# Patient Record
Sex: Female | Born: 1980 | Race: White | Hispanic: No | Marital: Single | Smoking: Current every day smoker
Health system: Southern US, Community
[De-identification: ages and names within clinical notes are randomized; demographics above are authoritative.]

## PROBLEM LIST (undated history)

## (undated) DIAGNOSIS — F419 Anxiety disorder, unspecified: Secondary | ICD-10-CM

## (undated) DIAGNOSIS — G4700 Insomnia, unspecified: Secondary | ICD-10-CM

## (undated) DIAGNOSIS — F1910 Other psychoactive substance abuse, uncomplicated: Secondary | ICD-10-CM

## (undated) DIAGNOSIS — F329 Major depressive disorder, single episode, unspecified: Secondary | ICD-10-CM

## (undated) DIAGNOSIS — K8020 Calculus of gallbladder without cholecystitis without obstruction: Secondary | ICD-10-CM

## (undated) HISTORY — DX: Other psychoactive substance abuse, uncomplicated: F19.10

---

## 2010-02-27 ENCOUNTER — Ambulatory Visit: Payer: Self-pay | Admitting: Internal Medicine

## 2010-02-27 ENCOUNTER — Inpatient Hospital Stay (HOSPITAL_COMMUNITY)
Admission: EM | Admit: 2010-02-27 | Discharge: 2010-03-03 | Disposition: A | Discharge status: Other Acute Inpatient Hospital | Payer: Self-pay | Source: Home / Self Care | Admitting: Emergency Medicine

## 2010-03-03 ENCOUNTER — Ambulatory Visit: Payer: Self-pay | Admitting: Psychiatry

## 2010-03-03 ENCOUNTER — Inpatient Hospital Stay (HOSPITAL_COMMUNITY): Admission: AD | Admit: 2010-03-03 | Discharge: 2010-03-07 | Payer: Self-pay | Admitting: Psychiatry

## 2010-04-20 ENCOUNTER — Ambulatory Visit: Payer: Self-pay | Admitting: Psychiatry

## 2010-08-19 LAB — DIFFERENTIAL
Eosinophils Absolute: 0.3 10*3/uL (ref 0.0–0.7)
Eosinophils Absolute: 0.3 10*3/uL (ref 0.0–0.7)
Eosinophils Relative: 1 % (ref 0–5)
Lymphocytes Relative: 21 % (ref 12–46)
Lymphocytes Relative: 32 % (ref 12–46)
Lymphs Abs: 2.7 10*3/uL (ref 0.7–4.0)
Lymphs Abs: 4.8 10*3/uL — ABNORMAL HIGH (ref 0.7–4.0)
Monocytes Absolute: 0.4 10*3/uL (ref 0.1–1.0)
Monocytes Relative: 2 % — ABNORMAL LOW (ref 3–12)
Monocytes Relative: 7 % (ref 3–12)
Neutro Abs: 4.9 10*3/uL (ref 1.7–7.7)
Neutrophils Relative %: 58 % (ref 43–77)

## 2010-08-19 LAB — CK TOTAL AND CKMB (NOT AT ARMC): Relative Index: 4.4 — ABNORMAL HIGH (ref 0.0–2.5)

## 2010-08-19 LAB — GLUCOSE, CAPILLARY
Glucose-Capillary: 101 mg/dL — ABNORMAL HIGH (ref 70–99)
Glucose-Capillary: 105 mg/dL — ABNORMAL HIGH (ref 70–99)
Glucose-Capillary: 106 mg/dL — ABNORMAL HIGH (ref 70–99)
Glucose-Capillary: 109 mg/dL — ABNORMAL HIGH (ref 70–99)
Glucose-Capillary: 111 mg/dL — ABNORMAL HIGH (ref 70–99)
Glucose-Capillary: 27 mg/dL — CL (ref 70–99)
Glucose-Capillary: 31 mg/dL — CL (ref 70–99)
Glucose-Capillary: 79 mg/dL (ref 70–99)
Glucose-Capillary: 86 mg/dL (ref 70–99)
Glucose-Capillary: 90 mg/dL (ref 70–99)
Glucose-Capillary: 91 mg/dL (ref 70–99)

## 2010-08-19 LAB — URINALYSIS, ROUTINE W REFLEX MICROSCOPIC
Ketones, ur: NEGATIVE mg/dL
Leukocytes, UA: NEGATIVE
Nitrite: NEGATIVE
Protein, ur: 100 mg/dL — AB
pH: 5.5 (ref 5.0–8.0)

## 2010-08-19 LAB — CBC
HCT: 34.8 % — ABNORMAL LOW (ref 36.0–46.0)
HCT: 35.2 % — ABNORMAL LOW (ref 36.0–46.0)
HCT: 39.1 % (ref 36.0–46.0)
Hemoglobin: 12 g/dL (ref 12.0–15.0)
Hemoglobin: 13.5 g/dL (ref 12.0–15.0)
Hemoglobin: 15.2 g/dL — ABNORMAL HIGH (ref 12.0–15.0)
MCH: 29.9 pg (ref 26.0–34.0)
MCH: 30.2 pg (ref 26.0–34.0)
MCH: 30.2 pg (ref 26.0–34.0)
MCH: 31.4 pg (ref 26.0–34.0)
MCHC: 33.3 g/dL (ref 30.0–36.0)
MCHC: 33.6 g/dL (ref 30.0–36.0)
MCHC: 34.1 g/dL (ref 30.0–36.0)
MCHC: 34.5 g/dL (ref 30.0–36.0)
MCV: 86.5 fL (ref 78.0–100.0)
MCV: 88.7 fL (ref 78.0–100.0)
Platelets: 264 K/uL (ref 150–400)
Platelets: 320 K/uL (ref 150–400)
Platelets: 364 10*3/uL (ref 150–400)
RBC: 3.97 MIL/uL (ref 3.87–5.11)
RBC: 4.52 MIL/uL (ref 3.87–5.11)
RBC: 4.84 MIL/uL (ref 3.87–5.11)
RDW: 12.4 % (ref 11.5–15.5)
RDW: 12.7 % (ref 11.5–15.5)
RDW: 12.9 % (ref 11.5–15.5)
WBC: 14.6 K/uL — ABNORMAL HIGH (ref 4.0–10.5)
WBC: 8.6 K/uL (ref 4.0–10.5)

## 2010-08-19 LAB — CARDIAC PANEL(CRET KIN+CKTOT+MB+TROPI)
CK, MB: 10.7 ng/mL (ref 0.3–4.0)
CK, MB: 6.4 ng/mL (ref 0.3–4.0)
CK, MB: 7.4 ng/mL (ref 0.3–4.0)
Relative Index: 2.5 (ref 0.0–2.5)
Relative Index: 2.9 — ABNORMAL HIGH (ref 0.0–2.5)
Relative Index: 4.1 — ABNORMAL HIGH (ref 0.0–2.5)
Relative Index: 4.4 — ABNORMAL HIGH (ref 0.0–2.5)
Total CK: 169 U/L (ref 7–177)
Total CK: 260 U/L — ABNORMAL HIGH (ref 7–177)
Total CK: 363 U/L — ABNORMAL HIGH (ref 7–177)
Troponin I: 0.52 ng/mL (ref 0.00–0.06)

## 2010-08-19 LAB — BASIC METABOLIC PANEL WITH GFR
BUN: <1 mg/dL — ABNORMAL LOW (ref 6–23)
BUN: <1 mg/dL — ABNORMAL LOW (ref 6–23)
CO2: 24 meq/L (ref 19–32)
CO2: 24 meq/L (ref 19–32)
Calcium: 8.2 mg/dL — ABNORMAL LOW (ref 8.4–10.5)
Calcium: 8.5 mg/dL (ref 8.4–10.5)
Chloride: 106 meq/L (ref 96–112)
Chloride: 110 meq/L (ref 96–112)
Creatinine, Ser: 0.57 mg/dL (ref 0.4–1.2)
Creatinine, Ser: 0.6 mg/dL (ref 0.4–1.2)
GFR calc non Af Amer: >60 mL/min
GFR calc non Af Amer: >60 mL/min
Glucose, Bld: 100 mg/dL — ABNORMAL HIGH (ref 70–99)
Glucose, Bld: 87 mg/dL (ref 70–99)
Potassium: 3.1 meq/L — ABNORMAL LOW (ref 3.5–5.1)
Potassium: 3.2 meq/L — ABNORMAL LOW (ref 3.5–5.1)
Sodium: 138 meq/L (ref 135–145)
Sodium: 140 meq/L (ref 135–145)

## 2010-08-19 LAB — BLOOD GAS, ARTERIAL
Acid-base deficit: 5.2 mmol/L — ABNORMAL HIGH (ref 0.0–2.0)
Bicarbonate: 18 meq/L — ABNORMAL LOW (ref 20.0–24.0)
Drawn by: 312761
FIO2: 0.5 %
MECHVT: 500 mL
O2 Saturation: 99.8 %
PEEP: 5 cmH2O
Patient temperature: 98.6
RATE: 22 {breaths}/min
TCO2: 18.8 mmol/L (ref 0–100)
pCO2 arterial: 25.9 mmHg — ABNORMAL LOW (ref 35.0–45.0)
pH, Arterial: 7.456 — ABNORMAL HIGH (ref 7.350–7.400)
pO2, Arterial: 197 mmHg — ABNORMAL HIGH (ref 80.0–100.0)

## 2010-08-19 LAB — COMPREHENSIVE METABOLIC PANEL WITH GFR
ALT: 13 U/L (ref 0–35)
ALT: 23 U/L (ref 0–35)
AST: 43 U/L — ABNORMAL HIGH (ref 0–37)
AST: 60 U/L — ABNORMAL HIGH (ref 0–37)
Albumin: 3.4 g/dL — ABNORMAL LOW (ref 3.5–5.2)
Albumin: 3.5 g/dL (ref 3.5–5.2)
Alkaline Phosphatase: 57 U/L (ref 39–117)
Alkaline Phosphatase: 57 U/L (ref 39–117)
BUN: 5 mg/dL — ABNORMAL LOW (ref 6–23)
BUN: 8 mg/dL (ref 6–23)
CO2: 21 meq/L (ref 19–32)
CO2: 24 meq/L (ref 19–32)
Calcium: 8.1 mg/dL — ABNORMAL LOW (ref 8.4–10.5)
Calcium: 9.3 mg/dL (ref 8.4–10.5)
Chloride: 102 meq/L (ref 96–112)
Chloride: 114 meq/L — ABNORMAL HIGH (ref 96–112)
Creatinine, Ser: 0.7 mg/dL (ref 0.4–1.2)
Creatinine, Ser: 0.75 mg/dL (ref 0.4–1.2)
GFR calc non Af Amer: >60 mL/min
GFR calc non Af Amer: >60 mL/min
Glucose, Bld: 111 mg/dL — ABNORMAL HIGH (ref 70–99)
Glucose, Bld: 80 mg/dL (ref 70–99)
Potassium: 3.1 meq/L — ABNORMAL LOW (ref 3.5–5.1)
Potassium: 5 meq/L (ref 3.5–5.1)
Sodium: 133 meq/L — ABNORMAL LOW (ref 135–145)
Sodium: 139 meq/L (ref 135–145)
Total Bilirubin: 0.4 mg/dL (ref 0.3–1.2)
Total Bilirubin: 1.9 mg/dL — ABNORMAL HIGH (ref 0.3–1.2)
Total Protein: 6.7 g/dL (ref 6.0–8.3)
Total Protein: 7.1 g/dL (ref 6.0–8.3)

## 2010-08-19 LAB — POCT I-STAT 3, ART BLOOD GAS (G3+)
Acid-base deficit: 6 mmol/L — ABNORMAL HIGH (ref 0.0–2.0)
Bicarbonate: 18.4 mEq/L — ABNORMAL LOW (ref 20.0–24.0)
O2 Saturation: 99 %
TCO2: 19 mmol/L (ref 0–100)
TCO2: 22 mmol/L (ref 0–100)
pCO2 arterial: 53.9 mmHg — ABNORMAL HIGH (ref 35.0–45.0)
pH, Arterial: 7.19 — CL (ref 7.350–7.400)
pO2, Arterial: 133 mmHg — ABNORMAL HIGH (ref 80.0–100.0)
pO2, Arterial: 471 mmHg — ABNORMAL HIGH (ref 80.0–100.0)

## 2010-08-19 LAB — COMPREHENSIVE METABOLIC PANEL
ALT: 22 U/L (ref 0–35)
AST: 43 U/L — ABNORMAL HIGH (ref 0–37)
Albumin: 4.3 g/dL (ref 3.5–5.2)
CO2: 15 mEq/L — ABNORMAL LOW (ref 19–32)
Calcium: 8.9 mg/dL (ref 8.4–10.5)
Creatinine, Ser: 1.77 mg/dL — ABNORMAL HIGH (ref 0.4–1.2)
GFR calc Af Amer: 42 mL/min — ABNORMAL LOW (ref >60)
Sodium: 137 mEq/L (ref 135–145)

## 2010-08-19 LAB — POCT I-STAT, CHEM 8
BUN: 17 mg/dL (ref 6–23)
Calcium, Ion: 1.04 mmol/L — ABNORMAL LOW (ref 1.12–1.32)
Creatinine, Ser: 1.3 mg/dL — ABNORMAL HIGH (ref 0.4–1.2)
Glucose, Bld: 271 mg/dL — ABNORMAL HIGH (ref 70–99)
Hemoglobin: 17 g/dL — ABNORMAL HIGH (ref 12.0–15.0)
Sodium: 136 mEq/L (ref 135–145)
TCO2: 12 mmol/L (ref 0–100)

## 2010-08-19 LAB — LIPID PANEL
HDL: 14 mg/dL — ABNORMAL LOW
HDL: 25 mg/dL — ABNORMAL LOW
Total CHOL/HDL Ratio: 3.6 RATIO
Total CHOL/HDL Ratio: 4.4 ratio
Total CHOL/HDL Ratio: 6 ratio
Triglycerides: 140 mg/dL
Triglycerides: 84 mg/dL
VLDL: 17 mg/dL (ref 0–40)
VLDL: 23 mg/dL (ref 0–40)
VLDL: 28 mg/dL (ref 0–40)

## 2010-08-19 LAB — ETHANOL: Alcohol, Ethyl (B): <5 mg/dL (ref 0–10)

## 2010-08-19 LAB — TSH: TSH: 1.098 u[IU]/mL (ref 0.350–4.500)

## 2010-08-19 LAB — RAPID URINE DRUG SCREEN, HOSP PERFORMED
Barbiturates: NOT DETECTED
Opiates: POSITIVE — AB

## 2010-08-19 LAB — URINE MICROSCOPIC-ADD ON

## 2010-08-19 LAB — URINE CULTURE
Colony Count: NO GROWTH
Culture: NO GROWTH

## 2010-08-19 LAB — TROPONIN I

## 2010-08-19 LAB — MRSA PCR SCREENING: MRSA by PCR: POSITIVE — AB

## 2010-08-19 LAB — PROTIME-INR
INR: 1.04 (ref 0.00–1.49)
Prothrombin Time: 13.8 s (ref 11.6–15.2)

## 2010-08-19 LAB — MAGNESIUM: Magnesium: 1.8 mg/dL (ref 1.5–2.5)

## 2010-08-19 LAB — POCT CARDIAC MARKERS: Myoglobin, poc: 141 ng/mL (ref 12–200)

## 2010-08-19 LAB — HEMOGLOBIN A1C
Hgb A1c MFr Bld: 5.2 %
Mean Plasma Glucose: 103 mg/dL

## 2010-08-19 LAB — KETONES, QUALITATIVE: Acetone, Bld: NEGATIVE

## 2011-01-14 ENCOUNTER — Emergency Department (HOSPITAL_COMMUNITY)
Admission: EM | Admit: 2011-01-14 | Discharge: 2011-01-14 | Disposition: A | Discharge status: Home / Self Care | Payer: Self-pay | Attending: Emergency Medicine | Admitting: Emergency Medicine

## 2011-01-14 DIAGNOSIS — F341 Dysthymic disorder: Secondary | ICD-10-CM | POA: Insufficient documentation

## 2011-01-14 DIAGNOSIS — H00019 Hordeolum externum unspecified eye, unspecified eyelid: Secondary | ICD-10-CM | POA: Insufficient documentation

## 2011-01-14 DIAGNOSIS — K612 Anorectal abscess: Secondary | ICD-10-CM | POA: Insufficient documentation

## 2011-01-14 DIAGNOSIS — K089 Disorder of teeth and supporting structures, unspecified: Secondary | ICD-10-CM | POA: Insufficient documentation

## 2011-05-10 ENCOUNTER — Emergency Department (HOSPITAL_COMMUNITY)
Admission: EM | Admit: 2011-05-10 | Discharge: 2011-05-10 | Disposition: A | Discharge status: Home / Self Care | Payer: Self-pay | Attending: Emergency Medicine | Admitting: Emergency Medicine

## 2011-05-10 ENCOUNTER — Encounter: Payer: Self-pay | Admitting: *Deleted

## 2011-05-10 DIAGNOSIS — R112 Nausea with vomiting, unspecified: Secondary | ICD-10-CM | POA: Insufficient documentation

## 2011-05-10 DIAGNOSIS — R197 Diarrhea, unspecified: Secondary | ICD-10-CM | POA: Insufficient documentation

## 2011-05-10 DIAGNOSIS — F1190 Opioid use, unspecified, uncomplicated: Secondary | ICD-10-CM

## 2011-05-10 DIAGNOSIS — F1110 Opioid abuse, uncomplicated: Secondary | ICD-10-CM | POA: Insufficient documentation

## 2011-05-10 DIAGNOSIS — F17200 Nicotine dependence, unspecified, uncomplicated: Secondary | ICD-10-CM | POA: Insufficient documentation

## 2011-05-10 DIAGNOSIS — R6883 Chills (without fever): Secondary | ICD-10-CM | POA: Insufficient documentation

## 2011-05-10 DIAGNOSIS — Z79899 Other long term (current) drug therapy: Secondary | ICD-10-CM | POA: Insufficient documentation

## 2011-05-10 DIAGNOSIS — R109 Unspecified abdominal pain: Secondary | ICD-10-CM | POA: Insufficient documentation

## 2011-05-10 DIAGNOSIS — F341 Dysthymic disorder: Secondary | ICD-10-CM | POA: Insufficient documentation

## 2011-05-10 DIAGNOSIS — K117 Disturbances of salivary secretion: Secondary | ICD-10-CM | POA: Insufficient documentation

## 2011-05-10 DIAGNOSIS — R61 Generalized hyperhidrosis: Secondary | ICD-10-CM | POA: Insufficient documentation

## 2011-05-10 HISTORY — DX: Insomnia, unspecified: G47.00

## 2011-05-10 HISTORY — DX: Major depressive disorder, single episode, unspecified: F32.9

## 2011-05-10 HISTORY — DX: Anxiety disorder, unspecified: F41.9

## 2011-05-10 LAB — CBC
MCH: 29.8 pg (ref 26.0–34.0)
MCHC: 34.2 g/dL (ref 30.0–36.0)
MCV: 87.1 fL (ref 78.0–100.0)
Platelets: 392 10*3/uL (ref 150–400)
RBC: 4.87 MIL/uL (ref 3.87–5.11)

## 2011-05-10 LAB — RAPID URINE DRUG SCREEN, HOSP PERFORMED: Barbiturates: NOT DETECTED

## 2011-05-10 LAB — BASIC METABOLIC PANEL
BUN: 7 mg/dL (ref 6–23)
Calcium: 10.1 mg/dL (ref 8.4–10.5)
GFR calc Af Amer: >90 mL/min (ref >90)
GFR calc non Af Amer: >90 mL/min (ref >90)
Glucose, Bld: 113 mg/dL — ABNORMAL HIGH (ref 70–99)
Sodium: 134 mEq/L — ABNORMAL LOW (ref 135–145)

## 2011-05-10 LAB — DIFFERENTIAL
Basophils Relative: 1 % (ref 0–1)
Eosinophils Absolute: 0.1 10*3/uL (ref 0.0–0.7)
Eosinophils Relative: 1 % (ref 0–5)
Lymphs Abs: 1.9 10*3/uL (ref 0.7–4.0)
Monocytes Relative: 7 % (ref 3–12)
Neutrophils Relative %: 70 % (ref 43–77)

## 2011-05-10 LAB — ETHANOL: Alcohol, Ethyl (B): <11 mg/dL (ref 0–11)

## 2011-05-10 MED ORDER — ACETAMINOPHEN 325 MG PO TABS
ORAL_TABLET | ORAL | Status: AC
  Filled 2011-05-10: qty 2

## 2011-05-10 MED ORDER — ACETAMINOPHEN 325 MG PO TABS
650.0000 mg | ORAL_TABLET | Freq: Once | ORAL | Status: AC
  Administered 2011-05-10: 650 mg via ORAL

## 2011-05-10 MED ORDER — ONDANSETRON 4 MG PO TBDP
8.0000 mg | ORAL_TABLET | Freq: Once | ORAL | Status: AC
  Administered 2011-05-10: 8 mg via ORAL
  Filled 2011-05-10: qty 2

## 2011-05-10 MED ORDER — CLONIDINE HCL 0.1 MG PO TABS
0.1000 mg | ORAL_TABLET | Freq: Once | ORAL | Status: AC
  Administered 2011-05-10: 0.1 mg via ORAL
  Filled 2011-05-10: qty 1

## 2011-05-10 NOTE — ED Notes (Signed)
D/W ACT team. Pt accepted to [facility]. Has an appointment there tonight at 9PM and is going to proceed there after she runs errands this evening.  Forbes Cellar, MD 05/10/11 [phone]

## 2011-05-10 NOTE — ED Notes (Addendum)
Kristen, ACT Team at bedside  
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
## 2011-05-10 NOTE — ED Notes (Signed)
Pt would like detox from opiates. Last use yesterday at 4. States spoke with [facility] and was told to come here for eval.

## 2011-05-10 NOTE — ED Notes (Signed)
Awaiting ACT Team. Pt NAD.

## 2011-05-10 NOTE — ED Provider Notes (Signed)
History     CSN: [id number] Arrival date & time: 05/10/2011 12:01 PM   First MD Initiated Contact with Patient 05/10/11 1211      Chief Complaint  Patient presents with  . Addiction Problem    (Consider location/radiation/quality/duration/timing/severity/associated sxs/prior treatment) Patient is a 30 y.o. female presenting with drug problem. The history is provided by the patient.  Drug Problem This is a chronic problem. Episode onset: 2 years. The problem occurs constantly. The problem has not changed since onset.Associated symptoms include abdominal pain. Pertinent negatives include no shortness of breath. Associated symptoms comments: Sweating, nausea, vomiting, chills, yawning. The symptoms are aggravated by nothing. The symptoms are relieved by narcotics. She has tried nothing for the symptoms. The treatment provided no relief.    Past Medical History  Diagnosis Date  . Depression   . Anxiety   . Insomnia     History reviewed. No pertinent past surgical history.  History reviewed. No pertinent family history.  History  Substance Use Topics  . Smoking status: Passive Smoker  . Smokeless tobacco: Not on file  . Alcohol Use: Yes    OB History    Grav Para Term Preterm Abortions TAB SAB Ect Mult Living                  Review of Systems  Constitutional: Positive for chills.  Respiratory: Negative for cough and shortness of breath.   Gastrointestinal: Positive for nausea, vomiting, abdominal pain and diarrhea.  Genitourinary: Negative for dysuria, vaginal bleeding and vaginal discharge.  All other systems reviewed and are negative.    Allergies  Review of patient's allergies indicates no known allergies.  Home Medications   Current Outpatient Rx  Name Route Sig Dispense Refill  . CLONAZEPAM 0.5 MG PO TABS Oral Take 0.5 mg by mouth daily.      Marland Kitchen ESCITALOPRAM OXALATE 20 MG PO TABS Oral Take 20 mg by mouth daily.      Marland Kitchen ZOLPIDEM TARTRATE 10 MG PO TABS Oral  Take 10 mg by mouth at bedtime as needed. Sleep        BP 147/113  Pulse 85  Temp(Src) 97.2 F (36.2 C) (Oral)  Resp 18  SpO2 99%  Physical Exam  Nursing note and vitals reviewed. Constitutional: She is oriented to person, place, and time. She appears well-developed and well-nourished. She appears distressed.  HENT:  Head: Normocephalic and atraumatic.  Mouth/Throat: Mucous membranes are dry.  Eyes: EOM are normal. Pupils are equal, round, and reactive to light.  Cardiovascular: Normal rate, regular rhythm, normal heart sounds and intact distal pulses.  Exam reveals no friction rub.   No murmur heard. Pulmonary/Chest: Effort normal and breath sounds normal. She has no wheezes. She has no rales.  Abdominal: Soft. Bowel sounds are normal. She exhibits no distension. There is no tenderness. There is no rebound and no guarding.  Musculoskeletal: Normal range of motion. She exhibits no tenderness.       No edema  Neurological: She is alert and oriented to person, place, and time. No cranial nerve deficit.  Skin: Skin is warm and dry. No rash noted.  Psychiatric: She has a normal mood and affect. Her behavior is normal.    ED Course  Procedures (including critical care time)   Labs Reviewed  CBC  DIFFERENTIAL  BASIC METABOLIC PANEL  POCT PREGNANCY, URINE  URINE RAPID DRUG SCREEN (HOSP PERFORMED)  ETHANOL   No results found.   No diagnosis found.  MDM   Patient here requesting detox from opiates. She's been using heroin daily for the last 2 years. Her last use was yesterday afternoon and currently she is experiencing withdrawal symptoms with abdominal pain, sweating, chills, nausea. Patient denies any SI or HI. No other complaints today. She went to spell that [facility] and was told to come here for clearance.       Gwyneth Sprout, MD 05/10/11 1556

## 2011-05-10 NOTE — ED Notes (Signed)
Requesting detox from IV heroin use. Reports daily use x 2 yrs, last used yesterday @ 1530. C/o withdrawal symptoms of n/v, abd pain, diaphoresis. Never been to detox before. Denies SI, HI

## 2011-05-10 NOTE — ED Notes (Signed)
Pt reports abdominal pain started yesterday.

## 2012-04-06 DIAGNOSIS — K8020 Calculus of gallbladder without cholecystitis without obstruction: Secondary | ICD-10-CM

## 2012-04-06 HISTORY — DX: Calculus of gallbladder without cholecystitis without obstruction: K80.20

## 2012-04-25 ENCOUNTER — Inpatient Hospital Stay (HOSPITAL_COMMUNITY)
Admission: EM | Admit: 2012-04-25 | Discharge: 2012-04-28 | DRG: 418 | Disposition: A | Discharge status: Home / Self Care | Payer: MEDICAID

## 2012-04-25 ENCOUNTER — Emergency Department (HOSPITAL_COMMUNITY): Payer: Self-pay

## 2012-04-25 ENCOUNTER — Encounter (HOSPITAL_COMMUNITY): Payer: Self-pay | Admitting: Emergency Medicine

## 2012-04-25 DIAGNOSIS — F329 Major depressive disorder, single episode, unspecified: Secondary | ICD-10-CM | POA: Diagnosis present

## 2012-04-25 DIAGNOSIS — K821 Hydrops of gallbladder: Secondary | ICD-10-CM | POA: Diagnosis present

## 2012-04-25 DIAGNOSIS — K8020 Calculus of gallbladder without cholecystitis without obstruction: Secondary | ICD-10-CM

## 2012-04-25 DIAGNOSIS — F1410 Cocaine abuse, uncomplicated: Secondary | ICD-10-CM | POA: Diagnosis present

## 2012-04-25 DIAGNOSIS — F3289 Other specified depressive episodes: Secondary | ICD-10-CM | POA: Diagnosis present

## 2012-04-25 DIAGNOSIS — F17200 Nicotine dependence, unspecified, uncomplicated: Secondary | ICD-10-CM | POA: Diagnosis present

## 2012-04-25 DIAGNOSIS — I10 Essential (primary) hypertension: Secondary | ICD-10-CM

## 2012-04-25 DIAGNOSIS — R030 Elevated blood-pressure reading, without diagnosis of hypertension: Secondary | ICD-10-CM | POA: Diagnosis present

## 2012-04-25 DIAGNOSIS — K8000 Calculus of gallbladder with acute cholecystitis without obstruction: Principal | ICD-10-CM | POA: Diagnosis present

## 2012-04-25 DIAGNOSIS — F411 Generalized anxiety disorder: Secondary | ICD-10-CM | POA: Diagnosis present

## 2012-04-25 DIAGNOSIS — O133 Gestational [pregnancy-induced] hypertension without significant proteinuria, third trimester: Secondary | ICD-10-CM | POA: Diagnosis present

## 2012-04-25 DIAGNOSIS — F1110 Opioid abuse, uncomplicated: Secondary | ICD-10-CM | POA: Diagnosis present

## 2012-04-25 DIAGNOSIS — G4700 Insomnia, unspecified: Secondary | ICD-10-CM | POA: Diagnosis present

## 2012-04-25 DIAGNOSIS — F32A Depression, unspecified: Secondary | ICD-10-CM | POA: Diagnosis present

## 2012-04-25 DIAGNOSIS — F1911 Other psychoactive substance abuse, in remission: Secondary | ICD-10-CM | POA: Diagnosis present

## 2012-04-25 DIAGNOSIS — F419 Anxiety disorder, unspecified: Secondary | ICD-10-CM | POA: Diagnosis present

## 2012-04-25 HISTORY — DX: Calculus of gallbladder without cholecystitis without obstruction: K80.20

## 2012-04-25 LAB — CBC WITH DIFFERENTIAL/PLATELET
Basophils Absolute: 0 10*3/uL (ref 0.0–0.1)
Eosinophils Absolute: 0.1 10*3/uL (ref 0.0–0.7)
Eosinophils Relative: 1 % (ref 0–5)
HCT: 42.5 % (ref 36.0–46.0)
MCH: 30.1 pg (ref 26.0–34.0)
MCHC: 35.3 g/dL (ref 30.0–36.0)
MCV: 85.3 fL (ref 78.0–100.0)
Monocytes Absolute: 0.6 10*3/uL (ref 0.1–1.0)
Platelets: 402 10*3/uL — ABNORMAL HIGH (ref 150–400)
RDW: 12.5 % (ref 11.5–15.5)
WBC: 11.9 10*3/uL — ABNORMAL HIGH (ref 4.0–10.5)

## 2012-04-25 LAB — URINALYSIS, MICROSCOPIC ONLY
Glucose, UA: NEGATIVE mg/dL
Hgb urine dipstick: NEGATIVE
Leukocytes, UA: NEGATIVE
Protein, ur: NEGATIVE mg/dL
Specific Gravity, Urine: 1.022 (ref 1.005–1.030)
Urobilinogen, UA: 0.2 mg/dL (ref 0.0–1.0)

## 2012-04-25 LAB — RAPID URINE DRUG SCREEN, HOSP PERFORMED
Barbiturates: NOT DETECTED
Benzodiazepines: NOT DETECTED
Cocaine: POSITIVE — AB
Tetrahydrocannabinol: NOT DETECTED

## 2012-04-25 LAB — COMPREHENSIVE METABOLIC PANEL
ALT: 20 U/L (ref 0–35)
AST: 20 U/L (ref 0–37)
Calcium: 10.4 mg/dL (ref 8.4–10.5)
Creatinine, Ser: 0.73 mg/dL (ref 0.50–1.10)
GFR calc Af Amer: >90 mL/min (ref >90)
GFR calc non Af Amer: >90 mL/min (ref >90)
Sodium: 140 mEq/L (ref 135–145)
Total Protein: 8.4 g/dL — ABNORMAL HIGH (ref 6.0–8.3)

## 2012-04-25 LAB — POCT I-STAT TROPONIN I

## 2012-04-25 MED ORDER — HYDROMORPHONE HCL PF 1 MG/ML IJ SOLN
1.0000 mg | Freq: Once | INTRAMUSCULAR | Status: AC
  Administered 2012-04-25: 1 mg via INTRAVENOUS
  Filled 2012-04-25: qty 1

## 2012-04-25 MED ORDER — DIPHENHYDRAMINE HCL 12.5 MG/5ML PO ELIX
12.5000 mg | ORAL_SOLUTION | Freq: Four times a day (QID) | ORAL | Status: DC | PRN
  Filled 2012-04-25: qty 10

## 2012-04-25 MED ORDER — ONDANSETRON 4 MG PO TBDP
8.0000 mg | ORAL_TABLET | Freq: Once | ORAL | Status: AC
  Administered 2012-04-25: 8 mg via ORAL
  Filled 2012-04-25: qty 2

## 2012-04-25 MED ORDER — GI COCKTAIL ~~LOC~~
30.0000 mL | Freq: Once | ORAL | Status: AC
  Administered 2012-04-25: 30 mL via ORAL
  Filled 2012-04-25: qty 30

## 2012-04-25 MED ORDER — ZOLPIDEM TARTRATE 5 MG PO TABS
5.0000 mg | ORAL_TABLET | Freq: Every evening | ORAL | Status: DC | PRN
  Administered 2012-04-25 – 2012-04-27 (×3): 5 mg via ORAL
  Filled 2012-04-25 (×3): qty 1

## 2012-04-25 MED ORDER — MORPHINE SULFATE 2 MG/ML IJ SOLN
1.0000 mg | INTRAMUSCULAR | Status: DC | PRN
  Administered 2012-04-25 (×2): 2 mg via INTRAVENOUS
  Administered 2012-04-25: 4 mg via INTRAVENOUS
  Administered 2012-04-25 (×2): 2 mg via INTRAVENOUS
  Administered 2012-04-25: 4 mg via INTRAVENOUS
  Administered 2012-04-26 (×5): 2 mg via INTRAVENOUS
  Administered 2012-04-27 (×2): 4 mg via INTRAVENOUS
  Filled 2012-04-25 (×3): qty 1
  Filled 2012-04-25: qty 2
  Filled 2012-04-25: qty 1
  Filled 2012-04-25: qty 2
  Filled 2012-04-25 (×3): qty 1
  Filled 2012-04-25: qty 2
  Filled 2012-04-25 (×2): qty 1
  Filled 2012-04-25: qty 2

## 2012-04-25 MED ORDER — PANTOPRAZOLE SODIUM 40 MG IV SOLR
40.0000 mg | Freq: Every day | INTRAVENOUS | Status: DC
  Administered 2012-04-25 – 2012-04-26 (×2): 40 mg via INTRAVENOUS
  Filled 2012-04-25 (×3): qty 40

## 2012-04-25 MED ORDER — BIOTENE DRY MOUTH MT LIQD: 15.0000 mL | OROMUCOSAL | Status: DC | PRN

## 2012-04-25 MED ORDER — DIPHENHYDRAMINE HCL 50 MG/ML IJ SOLN: 12.5000 mg | Freq: Four times a day (QID) | INTRAMUSCULAR | Status: DC | PRN

## 2012-04-25 MED ORDER — ZOLPIDEM TARTRATE 5 MG PO TABS: 10.0000 mg | ORAL_TABLET | Freq: Every evening | ORAL | Status: DC | PRN

## 2012-04-25 MED ORDER — DICYCLOMINE HCL 10 MG/ML IM SOLN
20.0000 mg | Freq: Once | INTRAMUSCULAR | Status: AC
  Administered 2012-04-25: 20 mg via INTRAMUSCULAR
  Filled 2012-04-25: qty 2

## 2012-04-25 MED ORDER — ESCITALOPRAM OXALATE 20 MG PO TABS
40.0000 mg | ORAL_TABLET | Freq: Every day | ORAL | Status: DC
Start: 2012-04-25 — End: 2012-04-28
  Administered 2012-04-27 – 2012-04-28 (×2): 40 mg via ORAL
  Filled 2012-04-25 (×4): qty 2

## 2012-04-25 MED ORDER — KCL IN DEXTROSE-NACL 20-5-0.45 MEQ/L-%-% IV SOLN
INTRAVENOUS | Status: DC
  Administered 2012-04-25 – 2012-04-27 (×4): via INTRAVENOUS
  Filled 2012-04-25 (×10): qty 1000

## 2012-04-25 MED ORDER — GABAPENTIN 300 MG PO CAPS
300.0000 mg | ORAL_CAPSULE | Freq: Three times a day (TID) | ORAL | Status: DC
  Administered 2012-04-27 – 2012-04-28 (×3): 300 mg via ORAL
  Filled 2012-04-25 (×11): qty 1

## 2012-04-25 MED ORDER — ONDANSETRON HCL 4 MG/2ML IJ SOLN
4.0000 mg | Freq: Four times a day (QID) | INTRAMUSCULAR | Status: DC | PRN
  Administered 2012-04-26 – 2012-04-27 (×2): 4 mg via INTRAVENOUS
  Filled 2012-04-25: qty 2

## 2012-04-25 MED ORDER — CIPROFLOXACIN IN D5W 400 MG/200ML IV SOLN
400.0000 mg | Freq: Two times a day (BID) | INTRAVENOUS | Status: DC
  Administered 2012-04-25 – 2012-04-27 (×4): 400 mg via INTRAVENOUS
  Filled 2012-04-25 (×7): qty 200

## 2012-04-25 MED ORDER — ARIPIPRAZOLE 5 MG PO TABS
5.0000 mg | ORAL_TABLET | Freq: Every day | ORAL | Status: DC
  Administered 2012-04-27 – 2012-04-28 (×2): 5 mg via ORAL
  Filled 2012-04-25 (×4): qty 1

## 2012-04-25 MED ORDER — FENTANYL CITRATE 0.05 MG/ML IJ SOLN
50.0000 ug | Freq: Once | INTRAMUSCULAR | Status: AC
  Administered 2012-04-25: 08:00:00 via INTRAVENOUS
  Filled 2012-04-25: qty 2

## 2012-04-25 MED ORDER — FENTANYL CITRATE 0.05 MG/ML IJ SOLN
75.0000 ug | Freq: Once | INTRAMUSCULAR | Status: AC
  Administered 2012-04-25: 75 ug via INTRAVENOUS
  Filled 2012-04-25: qty 2

## 2012-04-25 MED ORDER — HYDRALAZINE HCL 20 MG/ML IJ SOLN
10.0000 mg | Freq: Four times a day (QID) | INTRAMUSCULAR | Status: DC
  Administered 2012-04-25 – 2012-04-27 (×5): 10 mg via INTRAVENOUS
  Filled 2012-04-25 (×12): qty 0.5
  Filled 2012-04-25: qty 1
  Filled 2012-04-25 (×3): qty 0.5

## 2012-04-25 MED ORDER — CLONAZEPAM 0.5 MG PO TABS
0.5000 mg | ORAL_TABLET | Freq: Every day | ORAL | Status: DC
  Administered 2012-04-27 – 2012-04-28 (×2): 0.5 mg via ORAL
  Filled 2012-04-25 (×2): qty 1

## 2012-04-25 NOTE — ED Notes (Signed)
Pt is resting quietly, nad. Education given regarding what to expect before and after surgery which is planned for tomorrow. Pt A&Ox4.

## 2012-04-25 NOTE — ED Notes (Signed)
Pt reports epigastric pain for about 2 hours, had similar pain about a week ago, has taking Mylanta, antacids, ibuprofen with no relief; denies n/v; describes as throbbing/burning pain

## 2012-04-25 NOTE — H&P (Signed)
Will plan on OR tomorrow.  Marta Lamas. Gae Bon, MD, FACS [phone] [facility]

## 2012-04-25 NOTE — ED Notes (Signed)
MD at bedside. - consulting surgery

## 2012-04-25 NOTE — ED Provider Notes (Addendum)
History     CSN: [id number]  Arrival date & time 04/25/12  0004   First MD Initiated Contact with Patient 04/25/12 0048      Chief Complaint  Patient presents with  . Abdominal Pain    (Consider location/radiation/quality/duration/timing/severity/associated sxs/prior treatment) HPI 31 year old female presents to emergency apartment complaining of epigastric pain lasting about 2 hours. Patient reports she had similar episode of pain about a week ago. She has some radiation of the pain around the right side of her abdomen into her back. She denies any fever, nausea vomiting. Patient has taken Mylanta, antacids and ibuprofen without improvement of symptoms. No prior abdominal surgeries   Past Medical History  Diagnosis Date  . Depression   . Anxiety   . Insomnia     History reviewed. No pertinent past surgical history.  History reviewed. No pertinent family history.  History  Substance Use Topics  . Smoking status: Passive Smoke Exposure - Never Smoker -- 1.0 packs/day    Types: Cigarettes  . Smokeless tobacco: Not on file  . Alcohol Use: 2.1 oz/week    1 Cans of beer, 3 Drinks containing 0.5 oz of alcohol per week     Comment: occasion    OB History    Grav Para Term Preterm Abortions TAB SAB Ect Mult Living                  Review of Systems   See History of Present Illness; otherwise all other systems are reviewed and negative  Allergies  Review of patient's allergies indicates no known allergies.  Home Medications   Current Outpatient Rx  Name  Route  Sig  Dispense  Refill  . ARIPIPRAZOLE 5 MG PO TABS   Oral   Take 5 mg by mouth daily.         Marland Kitchen ESCITALOPRAM OXALATE 20 MG PO TABS   Oral   Take 40 mg by mouth daily.          Marland Kitchen GABAPENTIN 300 MG PO CAPS   Oral   Take 300 mg by mouth 3 (three) times daily.         Marland Kitchen ZOLPIDEM TARTRATE 10 MG PO TABS   Oral   Take 10 mg by mouth at bedtime as needed. Sleep           . CLONAZEPAM 0.5 MG PO  TABS   Oral   Take 0.5 mg by mouth daily.             BP 152/102  Pulse 68  Temp 98.2 F (36.8 C) (Oral)  Resp 17  SpO2 99%  LMP 04/20/2012  Physical Exam  Nursing note and vitals reviewed. Constitutional: She is oriented to person, place, and time. She appears well-developed and well-nourished.  HENT:  Head: Normocephalic and atraumatic.  Nose: Nose normal.  Mouth/Throat: Oropharynx is clear and moist.  Eyes: Conjunctivae normal and EOM are normal. Pupils are equal, round, and reactive to light.  Neck: Normal range of motion. Neck supple. No JVD present. No tracheal deviation present. No thyromegaly present.  Cardiovascular: Normal rate, regular rhythm, normal heart sounds and intact distal pulses.  Exam reveals no gallop and no friction rub.   No murmur heard. Pulmonary/Chest: Effort normal and breath sounds normal. No stridor. No respiratory distress. She has no wheezes. She has no rales. She exhibits no tenderness.  Abdominal: Soft. Bowel sounds are normal. She exhibits no distension and no mass. There is tenderness (she will tendernessin  epigastrium, slightly in right upper quadrant without Murphy sign appreciated). There is no rebound and no guarding.  Musculoskeletal: Normal range of motion. She exhibits no edema and no tenderness.  Lymphadenopathy:    She has no cervical adenopathy.  Neurological: She is alert and oriented to person, place, and time. She exhibits normal muscle tone. Coordination normal.  Skin: Skin is warm and dry. No rash noted. No erythema. No pallor.  Psychiatric: She has a normal mood and affect. Her behavior is normal. Judgment and thought content normal.    ED Course  Procedures (including critical care time)  Labs Reviewed  CBC WITH DIFFERENTIAL - Abnormal; Notable for the following:    WBC 11.9 (*)     Platelets 402 (*)     All other components within normal limits  COMPREHENSIVE METABOLIC PANEL - Abnormal; Notable for the following:     Glucose, Bld 102 (*)     Total Protein 8.4 (*)     Total Bilirubin 0.2 (*)     All other components within normal limits  URINALYSIS, MICROSCOPIC ONLY - Abnormal; Notable for the following:    APPearance CLOUDY (*)     All other components within normal limits  URINE RAPID DRUG SCREEN (HOSP PERFORMED) - Abnormal; Notable for the following:    Cocaine POSITIVE (*)     All other components within normal limits  LIPASE, BLOOD  POCT I-STAT TROPONIN I  POCT PREGNANCY, URINE   US Abdomen Complete  04/25/2012  *RADIOLOGY REPORT*  Clinical Data:  Upper abdominal pain  COMPLETE ABDOMINAL ULTRASOUND  Comparison:  None.  Findings:  Gallbladder:  Gallstones are present.  Gallbladder wall is upper normal in thickness.  Positive Murphy's sign.  Common bile duct:  Poorly visualized distally, measuring 4 mm where seen proximally.  Liver:  Heterogeneous echogenicity may be technical.  Mild fatty infiltration not excluded.  IVC:  Appears normal.  Pancreas:  Inadequately visualized due to bowel gas artifact.  Spleen:  Measures 8 cm oblique.  No focal abnormality.  Right Kidney:  Measures 10 cm.  No hydronephrosis or focal abnormality.  Left Kidney:  Measures 9.5 cm.  No hydronephrosis or focal abnormality.  Abdominal aorta:  No aneurysm identified.  IMPRESSION: Cholelithiasis with positive sonographic Murphy's sign.  Acute cholecystitis not excluded.  CBD and pancreas inadequately characterized.   Original Report Authenticated By: Jearld Lesch, M.D.     Date: 04/25/2012  Rate: 59  Rhythm: normal sinus rhythm  QRS Axis: right  Intervals: normal  ST/T Wave abnormalities: normal  Conduction Disutrbances:none  Narrative Interpretation:   Old EKG Reviewed: none available    1. Cholelithiasis       MDM  31 year old female with epigastric pain. Differential includes gastritis, cholecystitis/cholelithiasis, pancreatitis. We'll get baseline labs, give Bentyl, GI cocktail and get right upper quadrant  ultrasound      5:24 AM Patent noted to have cholelithiasis with upper limits of normal gallbladder wall. She does not have pericholecystic fluid. Patient did have Murphy sign on exam. Patient is ongoing heroin user, and has had difficult to control pain. Discussed case with Dr. Donell Beers, who recommended drug screen. I am uncomfortable discharging the patient with oral pain medicine, and has not been able to properly control her pain the emergency department today.  Olivia Mackie, MD 04/25/12 1610  Olivia Mackie, MD 04/25/12 [id number]  Olivia Mackie, MD 04/25/12 [phone]

## 2012-04-25 NOTE — H&P (Signed)
Rebecca Hancock 1981/04/21  [id number].   Primary Care MD: none Chief Complaint/Reason for Consult: abdominal pain HPI: This is a 31 yo female who has a history of cocaine and heroin abuse who began having abdominal pain last night at 12 after eating a ham and cheese sandwich for supper.  No nausea or vomiting, but some diarrhea for the last two days.  She admits to epigastric pain that radiates to her back.  She came to the [facility] for further evaluation.  She had an ultrasound that showed + murphy's sign and mild gallbladder wall thickening with gallstones.  We have been asked to see the patient.  Review of Systems: Please see HPI, otherwise all other systems are negative  History reviewed. No pertinent family history.  Past Medical History  Diagnosis Date  . Depression   . Anxiety   . Insomnia     History reviewed. No pertinent past surgical history.  Social History:  reports that she has been smoking Cigarettes.  She has been smoking about 1 pack per day. She does not have any smokeless tobacco history on file. She reports that she drinks about 2.1 ounces of alcohol per week. She reports that she uses illicit drugs (Heroin and Cocaine).  Allergies: No Known Allergies   (Not in a hospital admission)  Blood pressure 164/101, pulse 64, temperature 98.2 F (36.8 C), temperature source Oral, resp. rate 14, last menstrual period 04/20/2012, SpO2 100.00%. Physical Exam: General: WD, WN white female who is laying in bed in NAD HEENT: head is normocephalic, atraumatic.  Sclera are noninjected.  PERRL.  Ears and nose without any masses or lesions.  Mouth is pink and moist Heart: regular, rate, and rhythm.  Normal s1,s2. No obvious murmurs, gallops, or rubs noted.  Palpable radial and pedal pulses bilaterally Lungs: CTAB, no wheezes, rhonchi, or rales noted.  Respiratory effort nonlabored Abd: soft, tender in RUQ and epigastrium, ND, +BS, no masses, hernias, or organomegaly MS: all 4  extremities are symmetrical with no cyanosis, clubbing, or edema. Skin: warm and dry with no masses, lesions, or rashes Psych: A&Ox3 with an appropriate affect.    Results for orders placed during the hospital encounter of 04/25/12 (from the past 48 hour(s))  CBC WITH DIFFERENTIAL     Status: Abnormal   Collection Time   04/25/12 12:33 AM      Component Value Range Comment   WBC 11.9 (*) 4.0 - 10.5 K/uL    RBC 4.98  3.87 - 5.11 MIL/uL    Hemoglobin 15.0  12.0 - 15.0 g/dL    HCT 32.4  40.1 - 02.7 %    MCV 85.3  78.0 - 100.0 fL    MCH 30.1  26.0 - 34.0 pg    MCHC 35.3  30.0 - 36.0 g/dL    RDW 25.3  66.4 - 40.3 %    Platelets 402 (*) 150 - 400 K/uL    Neutrophils Relative 61  43 - 77 %    Neutro Abs 7.3  1.7 - 7.7 K/uL    Lymphocytes Relative 33  12 - 46 %    Lymphs Abs 3.9  0.7 - 4.0 K/uL    Monocytes Relative 5  3 - 12 %    Monocytes Absolute 0.6  0.1 - 1.0 K/uL    Eosinophils Relative 1  0 - 5 %    Eosinophils Absolute 0.1  0.0 - 0.7 K/uL    Basophils Relative 0  0 - 1 %  Basophils Absolute 0.0  0.0 - 0.1 K/uL   COMPREHENSIVE METABOLIC PANEL     Status: Abnormal   Collection Time   04/25/12 12:33 AM      Component Value Range Comment   Sodium 140  135 - 145 mEq/L    Potassium 3.7  3.5 - 5.1 mEq/L    Chloride 103  96 - 112 mEq/L    CO2 24  19 - 32 mEq/L    Glucose, Bld 102 (*) 70 - 99 mg/dL    BUN 7  6 - 23 mg/dL    Creatinine, Ser 1.91  0.50 - 1.10 mg/dL    Calcium 47.8  8.4 - 10.5 mg/dL    Total Protein 8.4 (*) 6.0 - 8.3 g/dL    Albumin 4.2  3.5 - 5.2 g/dL    AST 20  0 - 37 U/L    ALT 20  0 - 35 U/L    Alkaline Phosphatase 76  39 - 117 U/L    Total Bilirubin 0.2 (*) 0.3 - 1.2 mg/dL    GFR calc non Af Amer >90  >90 mL/min    GFR calc Af Amer >90  >90 mL/min   LIPASE, BLOOD     Status: Normal   Collection Time   04/25/12 12:33 AM      Component Value Range Comment   Lipase 35  11 - 59 U/L   POCT I-STAT TROPONIN I     Status: Normal   Collection Time    04/25/12 12:52 AM      Component Value Range Comment   Troponin i, poc 0.00  0.00 - 0.08 ng/mL    Comment 3            URINALYSIS, MICROSCOPIC ONLY     Status: Abnormal   Collection Time   04/25/12  3:26 AM      Component Value Range Comment   Color, Urine YELLOW  YELLOW    APPearance CLOUDY (*) CLEAR    Specific Gravity, Urine 1.022  1.005 - 1.030    pH 7.5  5.0 - 8.0    Glucose, UA NEGATIVE  NEGATIVE mg/dL    Hgb urine dipstick NEGATIVE  NEGATIVE    Bilirubin Urine NEGATIVE  NEGATIVE    Ketones, ur NEGATIVE  NEGATIVE mg/dL    Protein, ur NEGATIVE  NEGATIVE mg/dL    Urobilinogen, UA 0.2  0.0 - 1.0 mg/dL    Nitrite NEGATIVE  NEGATIVE    Leukocytes, UA NEGATIVE  NEGATIVE    Bacteria, UA RARE  RARE    Squamous Epithelial / LPF RARE  RARE    Urine-Other AMORPHOUS MATERIAL PRESENT     URINE RAPID DRUG SCREEN (HOSP PERFORMED)     Status: Abnormal   Collection Time   04/25/12  3:26 AM      Component Value Range Comment   Opiates NONE DETECTED  NONE DETECTED    Cocaine POSITIVE (*) NONE DETECTED    Benzodiazepines NONE DETECTED  NONE DETECTED    Amphetamines NONE DETECTED  NONE DETECTED    Tetrahydrocannabinol NONE DETECTED  NONE DETECTED    Barbiturates NONE DETECTED  NONE DETECTED   POCT PREGNANCY, URINE     Status: Normal   Collection Time   04/25/12  3:30 AM      Component Value Range Comment   Preg Test, Ur NEGATIVE  NEGATIVE    US Abdomen Complete  04/25/2012  *RADIOLOGY REPORT*  Clinical Data:  Upper abdominal pain  COMPLETE ABDOMINAL ULTRASOUND  Comparison:  None.  Findings:  Gallbladder:  Gallstones are present.  Gallbladder wall is upper normal in thickness.  Positive Murphy's sign.  Common bile duct:  Poorly visualized distally, measuring 4 mm where seen proximally.  Liver:  Heterogeneous echogenicity may be technical.  Mild fatty infiltration not excluded.  IVC:  Appears normal.  Pancreas:  Inadequately visualized due to bowel gas artifact.  Spleen:  Measures 8 cm  oblique.  No focal abnormality.  Right Kidney:  Measures 10 cm.  No hydronephrosis or focal abnormality.  Left Kidney:  Measures 9.5 cm.  No hydronephrosis or focal abnormality.  Abdominal aorta:  No aneurysm identified.  IMPRESSION: Cholelithiasis with positive sonographic Murphy's sign.  Acute cholecystitis not excluded.  CBD and pancreas inadequately characterized.   Original Report Authenticated By: Jearld Lesch, M.D.        Assessment/Plan 1. Early acute cholecystitis 2. Polysubstance abuse, recent cocaine and heroin use 3. Elevated BP, likely HTN  Plan: 1. We will admit the patient and plan on removing her gallbladder given ultrasound and clinical findings.  We will put the patient on medication for her elevated BP.  Given her recent use of cocaine and elevated BP, the patient is at risk for MI or CVA during operative intervention.  I have discussed this with the patient.  She will be placed on IV abx therapy and clear liquids today.  NPO p MN for OR tomorrow.  Champagne Paletta E 04/25/2012, 9:07 AM Pager: [phone]

## 2012-04-26 ENCOUNTER — Inpatient Hospital Stay (HOSPITAL_COMMUNITY): Payer: MEDICAID | Admitting: Anesthesiology

## 2012-04-26 ENCOUNTER — Encounter (HOSPITAL_COMMUNITY): Admission: EM | Disposition: A | Discharge status: Home / Self Care | Payer: Self-pay | Source: Home / Self Care

## 2012-04-26 ENCOUNTER — Encounter (HOSPITAL_COMMUNITY): Payer: Self-pay | Admitting: Anesthesiology

## 2012-04-26 DIAGNOSIS — K8000 Calculus of gallbladder with acute cholecystitis without obstruction: Secondary | ICD-10-CM

## 2012-04-26 HISTORY — PX: CHOLECYSTECTOMY: SHX55

## 2012-04-26 LAB — COMPREHENSIVE METABOLIC PANEL
ALT: 15 U/L (ref 0–35)
CO2: 23 mEq/L (ref 19–32)
Calcium: 9.4 mg/dL (ref 8.4–10.5)
Creatinine, Ser: 0.84 mg/dL (ref 0.50–1.10)
GFR calc Af Amer: >90 mL/min (ref >90)
GFR calc non Af Amer: >90 mL/min (ref >90)
Glucose, Bld: 109 mg/dL — ABNORMAL HIGH (ref 70–99)

## 2012-04-26 LAB — CBC
Hemoglobin: 14.4 g/dL (ref 12.0–15.0)
MCHC: 34.6 g/dL (ref 30.0–36.0)
Platelets: 389 10*3/uL (ref 150–400)
RBC: 4.8 MIL/uL (ref 3.87–5.11)

## 2012-04-26 SURGERY — LAPAROSCOPIC CHOLECYSTECTOMY
Anesthesia: General | Site: Abdomen | Wound class: Contaminated

## 2012-04-26 MED ORDER — BUPIVACAINE-EPINEPHRINE 0.25% -1:200000 IJ SOLN
INTRAMUSCULAR | Status: DC | PRN
  Administered 2012-04-26: 20 mL

## 2012-04-26 MED ORDER — LIDOCAINE HCL (CARDIAC) 20 MG/ML IV SOLN
INTRAVENOUS | Status: DC | PRN
  Administered 2012-04-26: 60 mg via INTRAVENOUS

## 2012-04-26 MED ORDER — SODIUM CHLORIDE 0.9 % IR SOLN
Status: DC | PRN
  Administered 2012-04-26: 1

## 2012-04-26 MED ORDER — SODIUM CHLORIDE 0.9 % IV SOLN: INTRAVENOUS | Status: DC | PRN

## 2012-04-26 MED ORDER — BUPIVACAINE-EPINEPHRINE PF 0.25-1:200000 % IJ SOLN
INTRAMUSCULAR | Status: AC
  Filled 2012-04-26: qty 30

## 2012-04-26 MED ORDER — PROPOFOL 10 MG/ML IV BOLUS
INTRAVENOUS | Status: DC | PRN
  Administered 2012-04-26: 200 mg via INTRAVENOUS

## 2012-04-26 MED ORDER — ROCURONIUM BROMIDE 100 MG/10ML IV SOLN
INTRAVENOUS | Status: DC | PRN
  Administered 2012-04-26 (×2): 5 mg via INTRAVENOUS
  Administered 2012-04-26: 35 mg via INTRAVENOUS

## 2012-04-26 MED ORDER — SODIUM CHLORIDE 0.9 % IR SOLN
Status: DC | PRN
  Administered 2012-04-26 (×3): 1

## 2012-04-26 MED ORDER — LACTATED RINGERS IV SOLN
INTRAVENOUS | Status: DC
  Administered 2012-04-26: 12:00:00 via INTRAVENOUS

## 2012-04-26 MED ORDER — HYDROMORPHONE HCL PF 1 MG/ML IJ SOLN
0.2500 mg | INTRAMUSCULAR | Status: DC | PRN
  Administered 2012-04-26 (×3): 0.5 mg via INTRAVENOUS

## 2012-04-26 MED ORDER — LACTATED RINGERS IV SOLN
INTRAVENOUS | Status: DC | PRN
  Administered 2012-04-26 (×2): via INTRAVENOUS

## 2012-04-26 MED ORDER — GLYCOPYRROLATE 0.2 MG/ML IJ SOLN
INTRAMUSCULAR | Status: DC | PRN
  Administered 2012-04-26: 0.6 mg via INTRAVENOUS

## 2012-04-26 MED ORDER — FENTANYL CITRATE 0.05 MG/ML IJ SOLN
INTRAMUSCULAR | Status: DC | PRN
  Administered 2012-04-26: 100 ug via INTRAVENOUS
  Administered 2012-04-26: 50 ug via INTRAVENOUS
  Administered 2012-04-26 (×2): 150 ug via INTRAVENOUS
  Administered 2012-04-26: 50 ug via INTRAVENOUS

## 2012-04-26 MED ORDER — ONDANSETRON HCL 4 MG/2ML IJ SOLN
INTRAMUSCULAR | Status: DC | PRN
  Administered 2012-04-26: 4 mg via INTRAVENOUS

## 2012-04-26 MED ORDER — HYDROMORPHONE HCL PF 1 MG/ML IJ SOLN
INTRAMUSCULAR | Status: AC
  Administered 2012-04-26: 0.5 mg via INTRAVENOUS
  Filled 2012-04-26: qty 1

## 2012-04-26 MED ORDER — LABETALOL HCL 5 MG/ML IV SOLN
INTRAVENOUS | Status: DC | PRN
  Administered 2012-04-26: 5 mg via INTRAVENOUS

## 2012-04-26 MED ORDER — MIDAZOLAM HCL 5 MG/5ML IJ SOLN
INTRAMUSCULAR | Status: DC | PRN
  Administered 2012-04-26 (×2): 1 mg via INTRAVENOUS

## 2012-04-26 MED ORDER — HYDROCODONE-ACETAMINOPHEN 5-325 MG PO TABS
1.0000 | ORAL_TABLET | ORAL | Status: DC | PRN
  Administered 2012-04-27: 1 via ORAL
  Administered 2012-04-27 – 2012-04-28 (×4): 2 via ORAL
  Filled 2012-04-26 (×5): qty 2

## 2012-04-26 MED ORDER — NEOSTIGMINE METHYLSULFATE 1 MG/ML IJ SOLN
INTRAMUSCULAR | Status: DC | PRN
  Administered 2012-04-26: 4 mg via INTRAVENOUS

## 2012-04-26 SURGICAL SUPPLY — 47 items
APPLIER CLIP 5 13 M/L LIGAMAX5 (MISCELLANEOUS) ×3
APPLIER CLIP ROT 10 11.4 M/L (STAPLE)
BLADE SURG ROTATE 9660 (MISCELLANEOUS) IMPLANT
CANISTER SUCTION 2500CC (MISCELLANEOUS) ×3 IMPLANT
CHLORAPREP W/TINT 26ML (MISCELLANEOUS) ×3 IMPLANT
CLIP APPLIE 5 13 M/L LIGAMAX5 (MISCELLANEOUS) ×2 IMPLANT
CLIP APPLIE ROT 10 11.4 M/L (STAPLE) IMPLANT
CLOTH BEACON ORANGE TIMEOUT ST (SAFETY) ×3 IMPLANT
COVER MAYO STAND STRL (DRAPES) ×3 IMPLANT
COVER SURGICAL LIGHT HANDLE (MISCELLANEOUS) ×3 IMPLANT
DECANTER SPIKE VIAL GLASS SM (MISCELLANEOUS) IMPLANT
DERMABOND ADVANCED (GAUZE/BANDAGES/DRESSINGS) ×1
DERMABOND ADVANCED .7 DNX12 (GAUZE/BANDAGES/DRESSINGS) ×2 IMPLANT
DRAPE C-ARM 42X72 X-RAY (DRAPES) ×3 IMPLANT
DRAPE UTILITY 15X26 W/TAPE STR (DRAPE) ×6 IMPLANT
DRSG TEGADERM 4X4.75 (GAUZE/BANDAGES/DRESSINGS) ×3 IMPLANT
ELECT REM PT RETURN 9FT ADLT (ELECTROSURGICAL) ×3
ELECTRODE REM PT RTRN 9FT ADLT (ELECTROSURGICAL) ×2 IMPLANT
GLOVE BIO SURGEON STRL SZ7.5 (GLOVE) ×3 IMPLANT
GLOVE BIOGEL PI IND STRL 6 (GLOVE) ×2 IMPLANT
GLOVE BIOGEL PI IND STRL 7.5 (GLOVE) ×2 IMPLANT
GLOVE BIOGEL PI IND STRL 8 (GLOVE) ×6 IMPLANT
GLOVE BIOGEL PI INDICATOR 6 (GLOVE) ×1
GLOVE BIOGEL PI INDICATOR 7.5 (GLOVE) ×1
GLOVE BIOGEL PI INDICATOR 8 (GLOVE) ×3
GLOVE ECLIPSE 7.0 STRL STRAW (GLOVE) ×3 IMPLANT
GLOVE ECLIPSE 7.5 STRL STRAW (GLOVE) ×3 IMPLANT
GOWN STRL NON-REIN LRG LVL3 (GOWN DISPOSABLE) ×12 IMPLANT
KIT BASIN OR (CUSTOM PROCEDURE TRAY) ×3 IMPLANT
KIT ROOM TURNOVER OR (KITS) ×3 IMPLANT
NS IRRIG 1000ML POUR BTL (IV SOLUTION) ×3 IMPLANT
PAD ARMBOARD 7.5X6 YLW CONV (MISCELLANEOUS) ×6 IMPLANT
POUCH SPECIMEN RETRIEVAL 10MM (ENDOMECHANICALS) IMPLANT
SCISSORS LAP 5X35 DISP (ENDOMECHANICALS) IMPLANT
SET CHOLANGIOGRAPH 5 50 .035 (SET/KITS/TRAYS/PACK) ×3 IMPLANT
SET IRRIG TUBING LAPAROSCOPIC (IRRIGATION / IRRIGATOR) ×3 IMPLANT
SLEEVE ENDOPATH XCEL 5M (ENDOMECHANICALS) ×6 IMPLANT
SPECIMEN JAR SMALL (MISCELLANEOUS) ×3 IMPLANT
SUT MNCRL AB 4-0 PS2 18 (SUTURE) ×3 IMPLANT
TOWEL OR 17X24 6PK STRL BLUE (TOWEL DISPOSABLE) ×3 IMPLANT
TOWEL OR 17X26 10 PK STRL BLUE (TOWEL DISPOSABLE) ×3 IMPLANT
TOWEL OR NON WOVEN STRL DISP B (DISPOSABLE) ×3 IMPLANT
TRAY LAPAROSCOPIC (CUSTOM PROCEDURE TRAY) ×3 IMPLANT
TROCAR XCEL BLUNT TIP 100MML (ENDOMECHANICALS) ×3 IMPLANT
TROCAR XCEL NON-BLD 11X100MML (ENDOMECHANICALS) IMPLANT
TROCAR XCEL NON-BLD 5MMX100MML (ENDOMECHANICALS) ×3 IMPLANT
WATER STERILE IRR 1000ML POUR (IV SOLUTION) IMPLANT

## 2012-04-26 NOTE — Transfer of Care (Signed)
Immediate Anesthesia Transfer of Care Note  Patient: Rebecca Hancock  Procedure(s) Performed: Procedure(s) (LRB) with comments: LAPAROSCOPIC CHOLECYSTECTOMY (N/A)  Patient Location: PACU  Anesthesia Type:General  Level of Consciousness: awake, alert  and patient cooperative  Airway & Oxygen Therapy: Patient Spontanous Breathing and Patient connected to nasal cannula oxygen  Post-op Assessment: Report given to PACU RN, Post -op Vital signs reviewed and stable and Patient moving all extremities  Post vital signs: Reviewed and stable  Complications: No apparent anesthesia complications

## 2012-04-26 NOTE — Progress Notes (Signed)
[facility] Progress Note    Subjective: Patient still having some discomfort.  NPO.  Objective: Vital signs in last 24 hours: Temp:  [98.1 F (36.7 C)-98.9 F (37.2 C)] 98.9 F (37.2 C) (11/21 0558) Pulse Rate:  [64-105] 92  (11/21 0558) Resp:  [16-20] 16  (11/21 0558) BP: (129-157)/(78-98) 133/79 mmHg (11/21 0558) SpO2:  [97 %-100 %] 97 % (11/21 0558) Weight:  [52.164 kg (115 lb)] 52.164 kg (115 lb) (11/20 1736)    Intake/Output from previous day: 11/20 0701 - 11/21 0700 In: 1766.7 [I.V.:1566.7; IV Piggyback:200] Out: -  Intake/Output this shift:    General: No distress.  Sleeping on arrival  Lungs: Clear  Abd: Soft, tender in epigastrium. Good bowel sounds  Extremities: No changes.  No DVT signs or symptoms  Neuro: Intact  Lab Results:   WBC is elevated a bit.  LFTs are normal. BMET  Basename 04/26/12 0500 04/25/12 0033  NA 136 140  K 3.8 3.7  CL 99 103  CO2 23 24  GLUCOSE 109* 102*  BUN 4* 7  CREATININE 0.84 0.73  CALCIUM 9.4 10.4   PT/INR No results found for this basename: LABPROT:2,INR:2 in the last 72 hours ABG No results found for this basename: PHART:2,PCO2:2,PO2:2,HCO3:2 in the last 72 hours  Studies/Results: US Abdomen Complete  04/25/2012  *RADIOLOGY REPORT*  Clinical Data:  Upper abdominal pain  COMPLETE ABDOMINAL ULTRASOUND  Comparison:  None.  Findings:  Gallbladder:  Gallstones are present.  Gallbladder wall is upper normal in thickness.  Positive Murphy's sign.  Common bile duct:  Poorly visualized distally, measuring 4 mm where seen proximally.  Liver:  Heterogeneous echogenicity may be technical.  Mild fatty infiltration not excluded.  IVC:  Appears normal.  Pancreas:  Inadequately visualized due to bowel gas artifact.  Spleen:  Measures 8 cm oblique.  No focal abnormality.  Right Kidney:  Measures 10 cm.  No hydronephrosis or focal abnormality.  Left Kidney:  Measures 9.5 cm.  No hydronephrosis or focal abnormality.  Abdominal aorta:  No  aneurysm identified.  IMPRESSION: Cholelithiasis with positive sonographic Murphy's sign.  Acute cholecystitis not excluded.  CBD and pancreas inadequately characterized.   Original Report Authenticated By: Jearld Lesch, M.D.     Anti-infectives: Anti-infectives     Start     Dose/Rate Route Frequency Ordered Stop   04/25/12 1400   ciprofloxacin (CIPRO) IVPB 400 mg        400 mg 200 mL/hr over 60 Minutes Intravenous Every 12 hours 04/25/12 1222            Assessment/Plan: s/p  For surgery today. Preoperative antibiotics  LOS: 1 day   Marta Lamas. Gae Bon, MD, FACS [phone] [facility] 04/26/2012

## 2012-04-26 NOTE — Anesthesia Postprocedure Evaluation (Signed)
  Anesthesia Post-op Note  Patient: Rebecca Hancock  Procedure(s) Performed: Procedure(s) (LRB) with comments: LAPAROSCOPIC CHOLECYSTECTOMY (N/A)  Patient Location: PACU  Anesthesia Type:General  Level of Consciousness: awake  Airway and Oxygen Therapy: Patient Spontanous Breathing  Post-op Pain: mild  Post-op Assessment: Post-op Vital signs reviewed  Post-op Vital Signs: Reviewed  Complications: No apparent anesthesia complications

## 2012-04-26 NOTE — Anesthesia Preprocedure Evaluation (Signed)
Anesthesia Evaluation  Patient identified by MRN, date of birth, ID band Patient awake    Reviewed: Allergy & Precautions, H&P , NPO status   Airway Mallampati: II      Dental   Pulmonary neg pulmonary ROS,          Cardiovascular negative cardio ROS      Neuro/Psych    GI/Hepatic negative GI ROS, Histo;ry of gallstones.   Endo/Other    Renal/GU      Musculoskeletal   Abdominal   Peds  Hematology   Anesthesia Other Findings   Reproductive/Obstetrics                           Anesthesia Physical Anesthesia Plan  ASA: III  Anesthesia Plan: General   Post-op Pain Management:    Induction: Intravenous  Airway Management Planned: Oral ETT  Additional Equipment:   Intra-op Plan:   Post-operative Plan: Extubation in OR  Informed Consent: I have reviewed the patients History and Physical, chart, labs and discussed the procedure including the risks, benefits and alternatives for the proposed anesthesia with the patient or authorized representative who has indicated his/her understanding and acceptance.     Plan Discussed with: CRNA, Anesthesiologist and Surgeon  Anesthesia Plan Comments:         Anesthesia Quick Evaluation

## 2012-04-26 NOTE — Preoperative (Signed)
Beta Blockers   Reason not to administer Beta Blockers:Not Applicable 

## 2012-04-26 NOTE — Op Note (Signed)
OPERATIVE REPORT  DATE OF OPERATION: 04/25/2012 - 04/26/2012  PATIENT:  Rebecca Hancock  31 y.o. female  PRE-OPERATIVE DIAGNOSIS:  ACUTE CHOLECYSTITIS  POST-OPERATIVE DIAGNOSIS:  ACUTE CHOLECYSTITIS/HYDROPS OF THE GALLBLADDER  PROCEDURE:  Procedure(s): LAPAROSCOPIC CHOLECYSTECTOMY  SURGEON:  Surgeon(s): Cherylynn Ridges, MD  ASSISTANT: Osbourne, PA-C  ANESTHESIA:   general  EBL: <30 ml  BLOOD ADMINISTERED: none  DRAINS: none   SPECIMEN:  Source of Specimen:  Gallbladder and stones  COUNTS CORRECT:  YES  PROCEDURE DETAILS: The patient was taken to the operating room and placed on the table in the supine position.  After an adequate endotracheal anesthetic was administered, she was prepped with ChloroPrep, and then draped in the usual manner exposing the entire abdomen laterally, inferiorly and up  to the costal margins.  After a proper timeout was performed including identifying the patient and the procedure to be performed, a infra-umbilical1.5cm midline incision was made using a #15 blade.  This was taken down to the fascia which was then incised with a #15 blade.  The edges of the fascia were tented up with Kocher clamps as the preperitoneal space was penetrated with a Kelly clamp into the peritoneum.  Once this was done, a pursestring suture of 0 Vicryl was passed around the fascial opening.  This was subsequently used to secure the [facility] cannula which was passed into the peritoneal cavity.  Once the [facility] cannula was in place, carbon dioxide gas was insufflated into the peritoneal cavity up to a maximal intra-abdominal pressure of 15mm Hg.The laparoscope, with attached camera and light source, was passed into the peritoneal cavity to visualize the direct insertion of two right upper quadrant 5mm cannulas, and a sup-xiphoid 5mm cannula.  Once all cannulas were in place, the dissection was begun.  The patient had an acutely inflammed gallbladder which was tense and had to be  decompressed with a needle aspirator.Two ratcheted graspers were attached to the dome and infundibulum of the gallbladder and retracted towards the anterior abdominal wall and the right upper quadrant.  Using cautery attached to a dissecting forceps, the peritoneum overlaying the triangle of Chalot and the hepatoduodenal triangle was dissected away exposing the cystic duct and the cystic artery.  A clip was placed on the gallbladder side of the cystic duct, then three distal clips were placed on the distal side and the cystic duct was transected.The gallbladder was then dissected out of the hepatic bed without event.  It was retrieved from the abdomen using an EndoCatch bag without event.  Once the gallbladder was removed, the bed was inspected for hemostasis.  Once excellent hemostasis was obtained all gas and fluids were aspirated from above the liver, then the cannulas were removed.  The infra-umbilical incision was closed using the pursestring suture which was in place.  0.25% bupivicaine with epinephrine was injected at all sites.  All 10mm or greater cannula sites were close using a running subcuticular stitch of 4-0 Monocryl.  5.21mm cannula sites were closed with Dermabond only.Steri-Strips and Tagaderm were used to complete the dressings at all sites.  At this point all needle, sponge, and instrument counts were correct.The patient was awakened from anesthesia and taken to the PACU in stable condition.  PATIENT DISPOSITION:  PACU - hemodynamically stable.   Rebecca Hancock O 11/21/20132:41 PM

## 2012-04-27 ENCOUNTER — Encounter (HOSPITAL_COMMUNITY): Payer: Self-pay | Admitting: General Surgery

## 2012-04-27 DIAGNOSIS — F419 Anxiety disorder, unspecified: Secondary | ICD-10-CM | POA: Diagnosis present

## 2012-04-27 DIAGNOSIS — F329 Major depressive disorder, single episode, unspecified: Secondary | ICD-10-CM | POA: Diagnosis present

## 2012-04-27 DIAGNOSIS — F32A Depression, unspecified: Secondary | ICD-10-CM | POA: Diagnosis present

## 2012-04-27 DIAGNOSIS — G4700 Insomnia, unspecified: Secondary | ICD-10-CM | POA: Diagnosis present

## 2012-04-27 HISTORY — DX: Depression, unspecified: F32.A

## 2012-04-27 HISTORY — DX: Insomnia, unspecified: G47.00

## 2012-04-27 HISTORY — DX: Anxiety disorder, unspecified: F41.9

## 2012-04-27 MED ORDER — PANTOPRAZOLE SODIUM 40 MG PO TBEC
40.0000 mg | DELAYED_RELEASE_TABLET | Freq: Every day | ORAL | Status: DC
  Administered 2012-04-27 – 2012-04-28 (×2): 40 mg via ORAL
  Filled 2012-04-27 (×2): qty 1

## 2012-04-27 MED ORDER — ACETAMINOPHEN 325 MG PO TABS: 650.0000 mg | ORAL_TABLET | Freq: Four times a day (QID) | ORAL | Status: DC | PRN

## 2012-04-27 MED ORDER — HYDROCODONE-ACETAMINOPHEN 5-325 MG PO TABS: 1.0000 | ORAL_TABLET | ORAL | Status: DC | PRN

## 2012-04-27 NOTE — Progress Notes (Signed)
Patient had been apprised of plan for discharge early today and stated that she would not have a ride unitl 1700 or so this evening, ate only small amount of solid food at lunch, offered multiple times to assist pt to walk and ambulate in the halls, refused, insisted finally that patient do so, ambulated 300 ft independently, tolerated well, when this RN entered room re discharge patient told this RN that Dr. Lindie Spruce said for her to stay till am, attempted to call Dr. Lindie Spruce regarding to discharge patient this evening or not, then noted that RX for pain med is unsigned by MD, Dr. Carolynne Edouard, on call spoke to via telephone and stated that he was not here to sign prescription and therefore "the patient will just have to stay",, patient encouraged to try to take solid food amap and ambulate, Berle Mull RN

## 2012-04-27 NOTE — Progress Notes (Signed)
Patient seen and examined.  Agree with PA's note. Soreness is improving.  Wounds look good.  Discharge instructions given.

## 2012-04-27 NOTE — Discharge Summary (Signed)
Physician Discharge Summary  Patient ID: Rosena Bartle MRN: [id number] DOB/AGE: 1981/03/25 31 y.o.  Admit date: 04/25/2012 Discharge date: 04/27/2012  Admission Diagnoses: 1. Early acute cholecystitis  2. Polysubstance abuse, recent cocaine and heroin use  3. Elevated BP, likely HTN Depression Anxiety Insomnia   Discharge Diagnoses: Same Principal Problem:  *Acute calculous cholecystitis Active Problems:  Polysubstance abuse  Elevated blood pressure  Depression  Anxiety  Insomnia   PROCEDURES: Laparoscopic Cholecystectomy  Hospital Course: This is a 31 yo female who has a history of cocaine and heroin abuse who began having abdominal pain last night at 12 after eating a ham and cheese sandwich for supper. No nausea or vomiting, but some diarrhea for the last two days. She admits to epigastric pain that radiates to her back. She came to the [facility] for further evaluation. She had an ultrasound that showed + murphy's sign and mild gallbladder wall thickening with gallstones. We have been asked to see the patient. Pt was seen in the ER, and admitted.  BP was up and controlled with hydralazine.  She was taken to the OR the next day and did well over night.  No nausea, some pain issues.  The following AM 04/27/12, her dressing were changed, and steri strips replaced.  We plan to mobilize, and advance her diet and send her home later today.  She is going home with her mother.  Follow up in 2-3 weeks with the [facility] clinic, or DR. Wyatt. Condition on D/c:  Improved.   Disposition: 01-Home or Self Care     Medication List     As of 04/27/2012  8:25 AM    TAKE these medications         acetaminophen 325 MG tablet   Commonly known as: TYLENOL   Take 2 tablets (650 mg total) by mouth every 6 (six) hours as needed.      ARIPiprazole 5 MG tablet   Commonly known as: ABILIFY   Take 5 mg by mouth daily.      clonazePAM 0.5 MG tablet   Commonly known as: KLONOPIN   Take 0.5 mg by mouth  daily.      escitalopram 20 MG tablet   Commonly known as: LEXAPRO   Take 40 mg by mouth daily.      gabapentin 300 MG capsule   Commonly known as: NEURONTIN   Take 300 mg by mouth 3 (three) times daily.      HYDROcodone-acetaminophen 5-325 MG per tablet   Commonly known as: NORCO/VICODIN   Take 1-2 tablets by mouth every 4 (four) hours as needed.      zolpidem 10 MG tablet   Commonly known as: AMBIEN   Take 10 mg by mouth at bedtime as needed. Sleep           Follow-up Information    Follow up with WYATT, Marta Lamas, MD. Schedule an appointment as soon as possible for a visit in 2 weeks. (Call for an appointment in 2-3 weeks.  Ask for the [facility] clinic or DR. Lindie Spruce.)    Contact information:   [address] [place] [address] [phone]          Signed: Sherrie George 04/27/2012, 8:25 AM

## 2012-04-27 NOTE — Progress Notes (Signed)
1 Day Post-Op  Subjective: Has been up to Bathroom, but not much else.  No nausea, up to regular diet.  Objective: Vital signs in last 24 hours: Temp:  [97.5 F (36.4 C)-99.4 F (37.4 C)] 97.6 F (36.4 C) (11/22 0607) Pulse Rate:  [69-107] 83  (11/22 0607) Resp:  [12-19] 16  (11/22 0607) BP: (102-145)/(57-95) 106/61 mmHg (11/22 0607) SpO2:  [91 %-100 %] 97 % (11/22 0607) Last BM Date: 04/24/12  Diet: regular, VSS, no labs  Intake/Output from previous day: 11/21 0701 - 11/22 0700 In: 2876.7 [P.O.:240; I.V.:2436.7; IV Piggyback:200] Out: 1200 [Urine:1200] Intake/Output this shift:    General appearance: alert, cooperative and very tender hard for her to move. GI: soft, +BS, no distension. incisions dressed and she has had some bleeding.  dressing and steristrips are wet and bloody.  Dressings removed wounds cleaned with betadine, and benzoine/steri strips applied.  Lab Results:   Basename 04/26/12 0500 04/25/12 0033  WBC 13.3* 11.9*  HGB 14.4 15.0  HCT 41.6 42.5  PLT 389 402*    BMET  Basename 04/26/12 0500 04/25/12 0033  NA 136 140  K 3.8 3.7  CL 99 103  CO2 23 24  GLUCOSE 109* 102*  BUN 4* 7  CREATININE 0.84 0.73  CALCIUM 9.4 10.4   PT/INR No results found for this basename: LABPROT:2,INR:2 in the last 72 hours   Lab 04/26/12 0500 04/25/12 0033  AST 20 20  ALT 15 20  ALKPHOS 69 76  BILITOT 0.5 0.2*  PROT 7.8 8.4*  ALBUMIN 3.7 4.2     Lipase     Component Value Date/Time   LIPASE 35 04/25/2012 0033     Studies/Results: No results found.  Medications:    . ARIPiprazole  5 mg Oral Daily  . ciprofloxacin  400 mg Intravenous Q12H  . clonazePAM  0.5 mg Oral Daily  . escitalopram  40 mg Oral Daily  . gabapentin  300 mg Oral TID  . hydrALAZINE  10 mg Intravenous Q6H  . pantoprazole (PROTONIX) IV  40 mg Intravenous QHS    Assessment/Plan 1.Early acute cholecystitis S/p cholecystectomy 04/26/12. 2. Polysubstance abuse, recent cocaine and  heroin use  3. Elevated BP, likely HTN  Plan:  Mobilize, saline lock IV Diet has just arrived.  She plans to go home with her mother.  Aim for D/c in Am.       LOS: 2 days    Itamar Mcgowan 04/27/2012

## 2012-04-28 ENCOUNTER — Encounter (INDEPENDENT_AMBULATORY_CARE_PROVIDER_SITE_OTHER): Payer: Self-pay | Admitting: General Surgery

## 2012-04-28 NOTE — Progress Notes (Signed)
Pt discharged to home by mother. IV discontinued discharge instructions explained pt verbalized understanding.

## 2012-04-28 NOTE — Progress Notes (Signed)
2 Days Post-Op  Subjective: Doing much better this AM, ready for d/c.  Objective: Vital signs in last 24 hours: Temp:  [97.6 F (36.4 C)-98 F (36.7 C)] 98 F (36.7 C) (11/23 0551) Pulse Rate:  [66-97] 67  (11/23 0551) Resp:  [17-18] 17  (11/23 0551) BP: (96-119)/(48-62) 109/51 mmHg (11/23 0551) SpO2:  [95 %-97 %] 95 % (11/23 0551) Last BM Date: 04/24/12 Diet: regular, afebrile, VSS,  Intake/Output from previous day: 11/22 0701 - 11/23 0700 In: 1016.7 [P.O.:480; I.V.:336.7; IV Piggyback:200] Out: 1300 [Urine:1300] Intake/Output this shift:    General appearance: alert, cooperative and no distress GI: soft, non-tender; bowel sounds normal; no masses,  no organomegaly and incisions look good.  abd soft and doing well.  Lab Results:   Basename 04/26/12 0500  WBC 13.3*  HGB 14.4  HCT 41.6  PLT 389    BMET  Basename 04/26/12 0500  NA 136  K 3.8  CL 99  CO2 23  GLUCOSE 109*  BUN 4*  CREATININE 0.84  CALCIUM 9.4   PT/INR No results found for this basename: LABPROT:2,INR:2 in the last 72 hours   Lab 04/26/12 0500 04/25/12 0033  AST 20 20  ALT 15 20  ALKPHOS 69 76  BILITOT 0.5 0.2*  PROT 7.8 8.4*  ALBUMIN 3.7 4.2     Lipase     Component Value Date/Time   LIPASE 35 04/25/2012 0033     Studies/Results: No results found.  Medications:    . ARIPiprazole  5 mg Oral Daily  . ciprofloxacin  400 mg Intravenous Q12H  . clonazePAM  0.5 mg Oral Daily  . escitalopram  40 mg Oral Daily  . gabapentin  300 mg Oral TID  . hydrALAZINE  10 mg Intravenous Q6H  . pantoprazole  40 mg Oral Daily  . [DISCONTINUED] pantoprazole (PROTONIX) IV  40 mg Intravenous QHS    Assessment/Plan *Acute calculous cholecystitis  Active Problems:  Polysubstance abuse  Elevated blood pressure  Depression  Anxiety  Insomnia   Plan: Home today,   LOS: 3 days    Rebecca Hancock 04/28/2012

## 2012-04-28 NOTE — Progress Notes (Signed)
Agree with above Ready for DC today F/u 2 weeks

## 2012-04-28 NOTE — Discharge Summary (Signed)
Probably will not go home until tomorrow.  Rebecca Hancock. Gae Bon, MD, FACS [phone] [facility]

## 2013-10-08 ENCOUNTER — Emergency Department (INDEPENDENT_AMBULATORY_CARE_PROVIDER_SITE_OTHER): Payer: Self-pay

## 2013-10-08 ENCOUNTER — Emergency Department (INDEPENDENT_AMBULATORY_CARE_PROVIDER_SITE_OTHER)
Admission: EM | Admit: 2013-10-08 | Discharge: 2013-10-08 | Disposition: A | Discharge status: Home / Self Care | Payer: Self-pay | Source: Home / Self Care

## 2013-10-08 ENCOUNTER — Other Ambulatory Visit (HOSPITAL_COMMUNITY)
Admission: RE | Admit: 2013-10-08 | Discharge: 2013-10-08 | Disposition: A | Discharge status: Home / Self Care | Payer: Self-pay | Source: Ambulatory Visit | Attending: Emergency Medicine | Admitting: Emergency Medicine

## 2013-10-08 ENCOUNTER — Encounter (HOSPITAL_COMMUNITY): Payer: Self-pay | Admitting: Emergency Medicine

## 2013-10-08 DIAGNOSIS — Z113 Encounter for screening for infections with a predominantly sexual mode of transmission: Secondary | ICD-10-CM | POA: Insufficient documentation

## 2013-10-08 DIAGNOSIS — N760 Acute vaginitis: Secondary | ICD-10-CM | POA: Insufficient documentation

## 2013-10-08 DIAGNOSIS — N949 Unspecified condition associated with female genital organs and menstrual cycle: Secondary | ICD-10-CM

## 2013-10-08 DIAGNOSIS — R109 Unspecified abdominal pain: Secondary | ICD-10-CM

## 2013-10-08 DIAGNOSIS — R102 Pelvic and perineal pain: Secondary | ICD-10-CM

## 2013-10-08 LAB — POCT URINALYSIS DIP (DEVICE)
Bilirubin Urine: NEGATIVE
Glucose, UA: NEGATIVE mg/dL
Hgb urine dipstick: NEGATIVE
KETONES UR: NEGATIVE mg/dL
LEUKOCYTES UA: NEGATIVE
Nitrite: NEGATIVE
PH: 5.5 (ref 5.0–8.0)
PROTEIN: NEGATIVE mg/dL
UROBILINOGEN UA: 0.2 mg/dL (ref 0.0–1.0)

## 2013-10-08 LAB — POCT PREGNANCY, URINE: Preg Test, Ur: NEGATIVE

## 2013-10-08 MED ORDER — FLUCONAZOLE 150 MG PO TABS: ORAL_TABLET | ORAL | Status: DC

## 2013-10-08 NOTE — ED Provider Notes (Signed)
Medical screening examination/treatment/procedure(s) were performed by non-physician practitioner and as supervising physician I was immediately available for consultation/collaboration.  Philipp Deputy, M.D.   Harden Mo, MD 10/08/13 [phone]

## 2013-10-08 NOTE — ED Notes (Signed)
C/o upper and lower abdominal pain and low back pain.  States the cramps x 3 days and are immobilizing her.  Tried Ibuprofen, Tylenol, Aleve, Midol.  Had irregular spotting 2 weeks and had cramps that day also.  She did a pregancy test 1 week ago that was neg.

## 2013-10-08 NOTE — Discharge Instructions (Signed)
Abdominal Pain, Women °Abdominal (stomach, pelvic, or belly) pain can be caused by many things. It is important to tell your doctor: °· The location of the pain. °· Does it come and go or is it present all the time? °· Are there things that start the pain (eating certain foods, exercise)? °· Are there other symptoms associated with the pain (fever, nausea, vomiting, diarrhea)? °All of this is helpful to know when trying to find the cause of the pain. °CAUSES  °· Stomach: virus or bacteria infection, or ulcer. °· Intestine: appendicitis (inflamed appendix), regional ileitis (Crohn's disease), ulcerative colitis (inflamed colon), irritable bowel syndrome, diverticulitis (inflamed diverticulum of the colon), or cancer of the stomach or intestine. °· Gallbladder disease or stones in the gallbladder. °· Kidney disease, kidney stones, or infection. °· Pancreas infection or cancer. °· Fibromyalgia (pain disorder). °· Diseases of the female organs: °· Uterus: fibroid (non-cancerous) tumors or infection. °· Fallopian tubes: infection or tubal pregnancy. °· Ovary: cysts or tumors. °· Pelvic adhesions (scar tissue). °· Endometriosis (uterus lining tissue growing in the pelvis and on the pelvic organs). °· Pelvic congestion syndrome (female organs filling up with blood just before the menstrual period). °· Pain with the menstrual period. °· Pain with ovulation (producing an egg). °· Pain with an IUD (intrauterine device, birth control) in the uterus. °· Cancer of the female organs. °· Functional pain (pain not caused by a disease, may improve without treatment). °· Psychological pain. °· Depression. °DIAGNOSIS  °Your doctor will decide the seriousness of your pain by doing an examination. °· Blood tests. °· X-rays. °· Ultrasound. °· CT scan (computed tomography, special type of X-ray). °· MRI (magnetic resonance imaging). °· Cultures, for infection. °· Barium enema (dye inserted in the large intestine, to better view it with  X-rays). °· Colonoscopy (looking in intestine with a lighted tube). °· Laparoscopy (minor surgery, looking in abdomen with a lighted tube). °· Major abdominal exploratory surgery (looking in abdomen with a large incision). °TREATMENT  °The treatment will depend on the cause of the pain.  °· Many cases can be observed and treated at home. °· Over-the-counter medicines recommended by your caregiver. °· Prescription medicine. °· Antibiotics, for infection. °· Birth control pills, for painful periods or for ovulation pain. °· Hormone treatment, for endometriosis. °· Nerve blocking injections. °· Physical therapy. °· Antidepressants. °· Counseling with a psychologist or psychiatrist. °· Minor or major surgery. °HOME CARE INSTRUCTIONS  °· Do not take laxatives, unless directed by your caregiver. °· Take over-the-counter pain medicine only if ordered by your caregiver. Do not take aspirin because it can cause an upset stomach or bleeding. °· Try a clear liquid diet (broth or water) as ordered by your caregiver. Slowly move to a bland diet, as tolerated, if the pain is related to the stomach or intestine. °· Have a thermometer and take your temperature several times a day, and record it. °· Bed rest and sleep, if it helps the pain. °· Avoid sexual intercourse, if it causes pain. °· Avoid stressful situations. °· Keep your follow-up appointments and tests, as your caregiver orders. °· If the pain does not go away with medicine or surgery, you may try: °· Acupuncture. °· Relaxation exercises (yoga, meditation). °· Group therapy. °· Counseling. °SEEK MEDICAL CARE IF:  °· You notice certain foods cause stomach pain. °· Your home care treatment is not helping your pain. °· You need stronger pain medicine. °· You want your IUD removed. °· You feel faint or   lightheaded. °· You develop nausea and vomiting. °· You develop a rash. °· You are having side effects or an allergy to your medicine. °SEEK IMMEDIATE MEDICAL CARE IF:  °· Your  pain does not go away or gets worse. °· You have a fever. °· Your pain is felt only in portions of the abdomen. The right side could possibly be appendicitis. The left lower portion of the abdomen could be colitis or diverticulitis. °· You are passing blood in your stools (bright red or black tarry stools, with or without vomiting). °· You have blood in your urine. °· You develop chills, with or without a fever. °· You pass out. °MAKE SURE YOU:  °· Understand these instructions. °· Will watch your condition. °· Will get help right away if you are not doing well or get worse. °Document Released: 03/20/2007 Document Revised: 08/15/2011 Document Reviewed: 04/09/2009 °ExitCare® Patient Information ©2014 [employer]. ° °Pelvic Pain, Female °Female pelvic pain can be caused by many different things and start from a variety of places. Pelvic pain refers to pain that is located in the lower half of the abdomen and between your hips. The pain may occur over a short period of time (acute) or may be reoccurring (chronic). The cause of pelvic pain may be related to disorders affecting the female reproductive organs (gynecologic), but it may also be related to the bladder, kidney stones, an intestinal complication, or muscle or skeletal problems. Getting help right away for pelvic pain is important, especially if there has been severe, sharp, or a sudden onset of unusual pain. It is also important to get help right away because some types of pelvic pain can be life threatening.  °CAUSES  °Below are only some of the causes of pelvic pain. The causes of pelvic pain can be in one of several categories.  °· Gynecologic. °· Pelvic inflammatory disease. °· Sexually transmitted infection. °· Ovarian cyst or a twisted ovarian ligament (ovarian torsion). °· Uterine lining that grows outside the uterus (endometriosis). °· Fibroids, cysts, or tumors. °· Ovulation. °· Pregnancy. °· Pregnancy that occurs outside the uterus (ectopic  pregnancy). °· Miscarriage. °· Labor. °· Abruption of the placenta or ruptured uterus. °· Infection. °· Uterine infection (endometritis). °· Bladder infection. °· Diverticulitis. °· Miscarriage related to a uterine infection (septic abortion). °· Bladder. °· Inflammation of the bladder (cystitis). °· Kidney stone(s). °· Gastrointenstinal. °· Constipation. °· Diverticulitis. °· Neurologic. °· Trauma. °· Feeling pelvic pain because of mental or emotional causes (psychosomatic). °· Cancers of the bowel or pelvis. °EVALUATION  °Your caregiver will want to take a careful history of your concerns. This includes recent changes in your health, a careful gynecologic history of your periods (menses), and a sexual history. Obtaining your family history and medical history is also important. Your caregiver may suggest a pelvic exam. A pelvic exam will help identify the location and severity of the pain. It also helps in the evaluation of which organ system may be involved. In order to identify the cause of the pelvic pain and be properly treated, your caregiver may order tests. These tests may include:  °· A pregnancy test. °· Pelvic ultrasonography. °· An X-ray exam of the abdomen. °· A urinalysis or evaluation of vaginal discharge. °· Blood tests. °HOME CARE INSTRUCTIONS  °· Only take over-the-counter or prescription medicines for pain, discomfort, or fever as directed by your caregiver.   °· Rest as directed by your caregiver.   °· Eat a balanced diet.   °· Drink enough fluids   to make your urine clear or pale yellow, or as directed.   °· Avoid sexual intercourse if it causes pain.   °· Apply warm or cold compresses to the lower abdomen depending on which one helps the pain.   °· Avoid stressful situations.   °· Keep a journal of your pelvic pain. Write down when it started, where the pain is located, and if there are things that seem to be associated with the pain, such as food or your menstrual cycle. °· Follow up with your  caregiver as directed.   °SEEK MEDICAL CARE IF: °· Your medicine does not help your pain. °· You have abnormal vaginal discharge. °SEEK IMMEDIATE MEDICAL CARE IF:  °· You have heavy bleeding from the vagina.   °· Your pelvic pain increases.   °· You feel lightheaded or faint.   °· You have chills.   °· You have pain with urination or blood in your urine.   °· You have uncontrolled diarrhea or vomiting.   °· You have a fever or persistent symptoms for more than 3 days. °· You have a fever and your symptoms suddenly get worse.   °· You are being physically or sexually abused.   °MAKE SURE YOU: °· Understand these instructions. °· Will watch your condition. °· Will get help if you are not doing well or get worse. °Document Released: 04/19/2004 Document Revised: 11/22/2011 Document Reviewed: 09/12/2011 °ExitCare® Patient Information ©2014 [employer]. ° °

## 2013-10-08 NOTE — ED Provider Notes (Signed)
CSN: [id number]     Arrival date & time 10/08/13  1837 History   First MD Initiated Contact with Patient 10/08/13 1918     Chief Complaint  Patient presents with  . Abdominal Pain   (Consider location/radiation/quality/duration/timing/severity/associated sxs/prior Treatment) HPI Comments: 33 year old female is having moderate to severe abdominal cramps primarily over the suprapubic area and occasionally radiating to the epigastrium. She states the pain has been bad for 3 days. However this is chronic she has been having these cramping pains for at least 6 years possibly longer. She has never sought medical care for diagnosis or treatment. Last week she had mid menstrual vaginal spotting. States she always has some usual vaginal discharge. Other than the cramps again more severe today there is not much difference in the character of the symptoms tan from the past one to 5 years. She is also asking for pregnancy test.   Past Medical History  Diagnosis Date  . Depression   . Anxiety   . Insomnia   . Cholelithiasis 04/2012  . Depression 04/27/2012  . Anxiety 04/27/2012  . Insomnia 04/27/2012   Past Surgical History  Procedure Laterality Date  . Cholecystectomy  04/26/2012    Procedure: LAPAROSCOPIC CHOLECYSTECTOMY;  Surgeon: Gwenyth Ober, MD;  Location: [facility] OR;  Service: General;  Laterality: N/A;   Family History  Problem Relation Age of Onset  . Hepatitis B Father   . Cancer Father     prostate   History  Substance Use Topics  . Smoking status: Current Every Day Smoker -- 0.50 packs/day for 10 years    Types: Cigarettes  . Smokeless tobacco: Never Used  . Alcohol Use: 2.1 oz/week    1 Cans of beer, 3 Drinks containing 0.5 oz of alcohol per week     Comment: occasional   OB History   Grav Para Term Preterm Abortions TAB SAB Ect Mult Living                 Review of Systems  Constitutional: Positive for activity change. Negative for fever and fatigue.  HENT: Negative.    Respiratory: Negative.   Cardiovascular: Negative.   Gastrointestinal: Positive for abdominal pain. Negative for nausea, vomiting, diarrhea, constipation, abdominal distention and rectal pain.  Genitourinary: Positive for vaginal discharge and pelvic pain. Negative for dysuria, flank pain, vaginal bleeding and vaginal pain.  Musculoskeletal: Negative.   Skin: Negative.   Neurological: Negative.     Allergies  Review of patient's allergies indicates no known allergies.  Home Medications   Prior to Admission medications   Medication Sig Start Date End Date Taking? Authorizing Provider  acetaminophen (TYLENOL) 325 MG tablet Take 2 tablets (650 mg total) by mouth every 6 (six) hours as needed. 04/27/12   Earnstine Regal, PA-C  ARIPiprazole (ABILIFY) 5 MG tablet Take 5 mg by mouth daily.    Historical Provider, MD  clonazePAM (KLONOPIN) 0.5 MG tablet Take 0.5 mg by mouth daily.      Historical Provider, MD  escitalopram (LEXAPRO) 20 MG tablet Take 40 mg by mouth daily.     Historical Provider, MD  gabapentin (NEURONTIN) 300 MG capsule Take 300 mg by mouth 3 (three) times daily.    Historical Provider, MD  HYDROcodone-acetaminophen (NORCO/VICODIN) 5-325 MG per tablet Take 1-2 tablets by mouth every 4 (four) hours as needed. 04/27/12   Earnstine Regal, PA-C  zolpidem (AMBIEN) 10 MG tablet Take 10 mg by mouth at bedtime as needed. Sleep  Historical Provider, MD   BP 143/95  Pulse 70  Temp(Src) 98.1 F (36.7 C) (Oral)  Resp 20  SpO2 98%  LMP 09/18/2013 Physical Exam  Nursing note and vitals reviewed. Constitutional: She is oriented to person, place, and time. She appears well-developed and well-nourished. No distress.  Neck: Normal range of motion. Neck supple.  Cardiovascular: Normal rate, regular rhythm and normal heart sounds.   Pulmonary/Chest: Effort normal and breath sounds normal.  Abdominal: Soft. She exhibits no distension and no mass. There is no rebound and no  guarding.  Deep palpation to the suprapubic area produces tenderness. Abdominal exam otherwise unremarkable.  Genitourinary: Vaginal discharge found.  NEFG Cx anterior and L of midline, pink, no lesions. Os nonparous. Vaginal vault and cx covered with cottage cheese like discharge.  Positive CMT. No adnexal tenderness.   Musculoskeletal: She exhibits no edema and no tenderness.  Neurological: She is alert and oriented to person, place, and time. She exhibits normal muscle tone.  Skin: Skin is warm and dry. No rash noted.    ED Course  Procedures (including critical care time) Labs Review Labs Reviewed  POCT URINALYSIS DIP (DEVICE)  POCT PREGNANCY, URINE  CERVICOVAGINAL ANCILLARY ONLY    Imaging Review Dg Abd 1 View  10/08/2013   CLINICAL DATA:  Abdominal pain with cramping.  Nausea.  EXAM: ABDOMEN - 1 VIEW  COMPARISON:  None.  FINDINGS: Normal bowel gas pattern. Single phlebolith in the right side of the pelvis. No osseous abnormality. No visible free air or free fluid on the supine radiograph.  IMPRESSION: Benign appearing abdomen.   Electronically Signed   By: Rozetta Nunnery M.D.   On: 10/08/2013 20:29     MDM   1. Pelvic pain   2. Abdominal cramping     Obtained urinalysis, urine pregnancy test, on x-ray the abdomen and pelvic exam. Possible etiologies for cramping/pain; bowel gas, uterine cramping, functional bowel disease, endometriosis.  No acute abdomen on todays exam.  To f/u with PCP, may nee to see GYN and GI as indicated.  Recommend naprosyn for now.    Janne Napoleon, NP 10/08/13 2101

## 2013-10-09 LAB — CERVICOVAGINAL ANCILLARY ONLY
CHLAMYDIA, DNA PROBE: NEGATIVE
NEISSERIA GONORRHEA: NEGATIVE
WET PREP (BD AFFIRM): NEGATIVE
Wet Prep (BD Affirm): NEGATIVE
Wet Prep (BD Affirm): POSITIVE — AB

## 2013-10-10 NOTE — ED Notes (Signed)
GC/Chlamydia neg., Affirm: Candida and Trich neg., Gardnerella pos.  Message sent to Janne Napoleon NP erythromycin 10/10/2013

## 2013-10-11 MED ORDER — METRONIDAZOLE 500 MG PO TABS: 500.0000 mg | ORAL_TABLET | Freq: Two times a day (BID) | ORAL | Status: DC

## 2013-10-12 ENCOUNTER — Telehealth (HOSPITAL_COMMUNITY): Payer: Self-pay | Admitting: *Deleted

## 2013-10-12 NOTE — ED Notes (Signed)
Janne Napoleon NP did Rx. of Flagyl but no pharmacy listed. I called pt. and left a message to call.  Call 1. Hanley Seamen Garyn Waguespack 10/12/2013

## 2013-10-13 NOTE — ED Notes (Signed)
I called pt. Pt. verified x 2 and given results.  Pt. told she needs Flagyl for bacterial vaginosis.  Pt. instructed to no alcohol while taking this medication. Pt. wants Rx. called to [facility] on [address].  Rx. called to pharmacy voicemail @ [phone] -closed until 1400. Hanley Seamen Gina Costilla 10/13/2013

## 2013-10-25 NOTE — ED Notes (Signed)
Recalled to [phone]-flagyl script recalled

## 2014-08-17 ENCOUNTER — Encounter (HOSPITAL_COMMUNITY): Payer: Self-pay

## 2014-08-17 ENCOUNTER — Emergency Department (HOSPITAL_COMMUNITY)
Admission: EM | Admit: 2014-08-17 | Discharge: 2014-08-18 | Discharge status: Against Medical Advice | Payer: Self-pay | Attending: Emergency Medicine | Admitting: Emergency Medicine

## 2014-08-17 DIAGNOSIS — Z720 Tobacco use: Secondary | ICD-10-CM | POA: Insufficient documentation

## 2014-08-17 DIAGNOSIS — R21 Rash and other nonspecific skin eruption: Secondary | ICD-10-CM | POA: Insufficient documentation

## 2014-08-17 NOTE — ED Notes (Addendum)
Patient reports having rash to bilateral wrists and ankles.  Large red areas noted to wrists.  Patient also reports she has recurrent BV and would like ABX to treat it.

## 2014-08-18 ENCOUNTER — Encounter (HOSPITAL_COMMUNITY): Payer: Self-pay | Admitting: *Deleted

## 2014-08-18 ENCOUNTER — Emergency Department (HOSPITAL_COMMUNITY)
Admission: EM | Admit: 2014-08-18 | Discharge: 2014-08-18 | Discharge status: Against Medical Advice | Payer: Self-pay | Attending: Emergency Medicine | Admitting: Emergency Medicine

## 2014-08-18 DIAGNOSIS — N898 Other specified noninflammatory disorders of vagina: Secondary | ICD-10-CM | POA: Insufficient documentation

## 2014-08-18 DIAGNOSIS — Z720 Tobacco use: Secondary | ICD-10-CM | POA: Insufficient documentation

## 2014-08-18 DIAGNOSIS — R21 Rash and other nonspecific skin eruption: Secondary | ICD-10-CM | POA: Insufficient documentation

## 2014-08-18 NOTE — ED Notes (Addendum)
Pt upset and reported that the MD came in room and left out stating that he/she had an emergency. Pt felt like that was rude and left. When this nurse returned to room, pt was no where to be found.

## 2014-08-18 NOTE — ED Notes (Signed)
Awake. Verbally responsive. A/O x4. Resp even and unlabored. No audible adventitious breath sounds noted. ABC's intact. Pt reported that she thought she might have BV d/t vaginal d/c of thick white secretions and foul odor and requesting to treated and red raise areas with pustulent d/c to bil upper ext and rt foot

## 2014-08-18 NOTE — ED Notes (Signed)
Patient reports having rash to bilateral wrists and ankles. Large red areas noted to wrists. Patient also reports she has recurrent bacterial vaginosis and would like ABX to treat it. Pt left before being seen by a doctor yesterday.

## 2014-08-18 NOTE — ED Notes (Signed)
Pt reported to Registration that she had been waiting for over 10hrs to be seen and that she was leaving.  Registration personnel explained that we are moving as quickly as possible and that there were only a few Pt ahead of her.  Pt verbalized understanding, but preceded to leave.

## 2014-11-19 ENCOUNTER — Encounter: Payer: Self-pay | Admitting: *Deleted

## 2014-12-15 ENCOUNTER — Encounter: Payer: Self-pay | Admitting: Obstetrics & Gynecology

## 2014-12-18 ENCOUNTER — Ambulatory Visit (INDEPENDENT_AMBULATORY_CARE_PROVIDER_SITE_OTHER): Payer: Medicaid Other | Admitting: Obstetrics & Gynecology

## 2014-12-18 ENCOUNTER — Other Ambulatory Visit: Payer: Self-pay | Admitting: Obstetrics & Gynecology

## 2014-12-18 ENCOUNTER — Encounter: Payer: Self-pay | Admitting: Obstetrics & Gynecology

## 2014-12-18 VITALS — BP 139/77 | HR 90 | Temp 98.8°F | Wt 152.7 lb

## 2014-12-18 DIAGNOSIS — O3680X1 Pregnancy with inconclusive fetal viability, fetus 1: Secondary | ICD-10-CM | POA: Diagnosis not present

## 2014-12-18 DIAGNOSIS — F1120 Opioid dependence, uncomplicated: Secondary | ICD-10-CM

## 2014-12-18 DIAGNOSIS — O99320 Drug use complicating pregnancy, unspecified trimester: Secondary | ICD-10-CM

## 2014-12-18 DIAGNOSIS — O0971 Supervision of high risk pregnancy due to social problems, first trimester: Secondary | ICD-10-CM | POA: Diagnosis present

## 2014-12-18 DIAGNOSIS — O0970 Supervision of high risk pregnancy due to social problems, unspecified trimester: Secondary | ICD-10-CM | POA: Insufficient documentation

## 2014-12-18 LAB — POCT URINALYSIS DIP (DEVICE)
BILIRUBIN URINE: NEGATIVE
GLUCOSE, UA: NEGATIVE mg/dL
HGB URINE DIPSTICK: NEGATIVE
Ketones, ur: NEGATIVE mg/dL
LEUKOCYTES UA: NEGATIVE
NITRITE: NEGATIVE
PROTEIN: NEGATIVE mg/dL
SPECIFIC GRAVITY, URINE: 1.025 (ref 1.005–1.030)
Urobilinogen, UA: 0.2 mg/dL (ref 0.0–1.0)
pH: 5.5 (ref 5.0–8.0)

## 2014-12-18 NOTE — Progress Notes (Signed)
   Subjective:Methadone pt    Rebecca Hancock is a G1P0 Unknown being seen today for her first obstetrical visit.  Her obstetrical history is significant for methadone. Patient does intend to breast feed. Pregnancy history fully reviewed.  Patient reports no complaints.  Filed Vitals:   12/18/14 1152  BP: 139/77  Pulse: 90  Temp: 98.8 F (37.1 C)  Weight: 152 lb 11.2 oz (69.264 kg)    HISTORY: OB History  Gravida Para Term Preterm AB SAB TAB Ectopic Multiple Living  1             # Outcome Date GA Lbr Len/2nd Weight Sex Delivery Anes PTL Lv  1 Current              Past Medical History  Diagnosis Date  . Depression   . Anxiety   . Insomnia   . Cholelithiasis 04/2012  . Depression 04/27/2012  . Anxiety 04/27/2012  . Insomnia 04/27/2012  . Insomnia   . Drug abuse     Heroin and opiates   Past Surgical History  Procedure Laterality Date  . Cholecystectomy  04/26/2012    Procedure: LAPAROSCOPIC CHOLECYSTECTOMY;  Surgeon: Gwenyth Ober, MD;  Location: [facility] OR;  Service: General;  Laterality: N/A;   Family History  Problem Relation Age of Onset  . Hepatitis B Father   . Cancer Father     prostate     Exam    Uterus:   12 week  Pelvic Exam:    Perineum:     Vulva:     Vagina:      pH:     Cervix:     Adnexa: not evaluated           Skin: normal coloration and turgor, no rashes    Neurologic: oriented, normal mood   Extremities: normal strength, tone, and muscle mass   HEENT sclera clear, anicteric   Mouth/Teeth dental hygiene good   Neck supple   Cardiovascular: regular rate and rhythm   Respiratory:  appears well, vitals normal, no respiratory distress, acyanotic, normal RR   Abdomen: soft, non-tender; bowel sounds normal; no masses,  no organomegaly          Assessment:    Pregnancy: G1P0 Patient Active Problem List   Diagnosis Date Noted  . Supervision of high risk pregnancy due to social problems, antepartum 12/18/2014  . Depression  04/27/2012  . Anxiety 04/27/2012  . Insomnia 04/27/2012  . Polysubstance abuse 04/25/2012  . Elevated blood pressure 04/25/2012        Plan:     Initial labs drawn. Prenatal vitamins. Problem list reviewed and updated. Genetic Screening discussed First Screen: requested.  Ultrasound discussed; fetal survey: 18 weeks.  Follow up in 4 weeks. 50% of 30 min visit spent on counseling and coordination of care.     Rebecca Hancock 12/18/2014

## 2014-12-18 NOTE — Patient Instructions (Signed)
First Trimester of Pregnancy The first trimester of pregnancy is from week 1 until the end of week 12 (months 1 through 3). A week after a sperm fertilizes an egg, the egg will implant on the wall of the uterus. This embryo will begin to develop into a baby. Genes from you and your partner are forming the baby. The female genes determine whether the baby is a boy or a girl. At 6-8 weeks, the eyes and face are formed, and the heartbeat can be seen on ultrasound. At the end of 12 weeks, all the baby's organs are formed.  Now that you are pregnant, you will want to do everything you can to have a healthy baby. Two of the most important things are to get good prenatal care and to follow your health care provider's instructions. Prenatal care is all the medical care you receive before the baby's birth. This care will help prevent, find, and treat any problems during the pregnancy and childbirth. BODY CHANGES Your body goes through many changes during pregnancy. The changes vary from woman to woman.   You may gain or lose a couple of pounds at first.  You may feel sick to your stomach (nauseous) and throw up (vomit). If the vomiting is uncontrollable, call your health care provider.  You may tire easily.  You may develop headaches that can be relieved by medicines approved by your health care provider.  You may urinate more often. Painful urination may mean you have a bladder infection.  You may develop heartburn as a result of your pregnancy.  You may develop constipation because certain hormones are causing the muscles that push waste through your intestines to slow down.  You may develop hemorrhoids or swollen, bulging veins (varicose veins).  Your breasts may begin to grow larger and become tender. Your nipples may stick out more, and the tissue that surrounds them (areola) may become darker.  Your gums may bleed and may be sensitive to brushing and flossing.  Dark spots or blotches (chloasma,  mask of pregnancy) may develop on your face. This will likely fade after the baby is born.  Your menstrual periods will stop.  You may have a loss of appetite.  You may develop cravings for certain kinds of food.  You may have changes in your emotions from day to day, such as being excited to be pregnant or being concerned that something may go wrong with the pregnancy and baby.  You may have more vivid and strange dreams.  You may have changes in your hair. These can include thickening of your hair, rapid growth, and changes in texture. Some women also have hair loss during or after pregnancy, or hair that feels dry or thin. Your hair will most likely return to normal after your baby is born. WHAT TO EXPECT AT YOUR PRENATAL VISITS During a routine prenatal visit:  You will be weighed to make sure you and the baby are growing normally.  Your blood pressure will be taken.  Your abdomen will be measured to track your baby's growth.  The fetal heartbeat will be listened to starting around week 10 or 12 of your pregnancy.  Test results from any previous visits will be discussed. Your health care provider may ask you:  How you are feeling.  If you are feeling the baby move.  If you have had any abnormal symptoms, such as leaking fluid, bleeding, severe headaches, or abdominal cramping.  If you have any questions. Other tests   that may be performed during your first trimester include:  Blood tests to find your blood type and to check for the presence of any previous infections. They will also be used to check for low iron levels (anemia) and Rh antibodies. Later in the pregnancy, blood tests for diabetes will be done along with other tests if problems develop.  Urine tests to check for infections, diabetes, or protein in the urine.  An ultrasound to confirm the proper growth and development of the baby.  An amniocentesis to check for possible genetic problems.  Fetal screens for  spina bifida and Down syndrome.  You may need other tests to make sure you and the baby are doing well. HOME CARE INSTRUCTIONS  Medicines  Follow your health care provider's instructions regarding medicine use. Specific medicines may be either safe or unsafe to take during pregnancy.  Take your prenatal vitamins as directed.  If you develop constipation, try taking a stool softener if your health care provider approves. Diet  Eat regular, well-balanced meals. Choose a variety of foods, such as meat or vegetable-based protein, fish, milk and low-fat dairy products, vegetables, fruits, and whole grain breads and cereals. Your health care provider will help you determine the amount of weight gain that is right for you.  Avoid raw meat and uncooked cheese. These carry germs that can cause birth defects in the baby.  Eating four or five small meals rather than three large meals a day may help relieve nausea and vomiting. If you start to feel nauseous, eating a few soda crackers can be helpful. Drinking liquids between meals instead of during meals also seems to help nausea and vomiting.  If you develop constipation, eat more high-fiber foods, such as fresh vegetables or fruit and whole grains. Drink enough fluids to keep your urine clear or pale yellow. Activity and Exercise  Exercise only as directed by your health care provider. Exercising will help you:  Control your weight.  Stay in shape.  Be prepared for labor and delivery.  Experiencing pain or cramping in the lower abdomen or low back is a good sign that you should stop exercising. Check with your health care provider before continuing normal exercises.  Try to avoid standing for long periods of time. Move your legs often if you must stand in one place for a long time.  Avoid heavy lifting.  Wear low-heeled shoes, and practice good posture.  You may continue to have sex unless your health care provider directs you  otherwise. Relief of Pain or Discomfort  Wear a good support bra for breast tenderness.   Take warm sitz baths to soothe any pain or discomfort caused by hemorrhoids. Use hemorrhoid cream if your health care provider approves.   Rest with your legs elevated if you have leg cramps or low back pain.  If you develop varicose veins in your legs, wear support hose. Elevate your feet for 15 minutes, 3-4 times a day. Limit salt in your diet. Prenatal Care  Schedule your prenatal visits by the twelfth week of pregnancy. They are usually scheduled monthly at first, then more often in the last 2 months before delivery.  Write down your questions. Take them to your prenatal visits.  Keep all your prenatal visits as directed by your health care provider. Safety  Wear your seat belt at all times when driving.  Make a list of emergency phone numbers, including numbers for family, friends, the hospital, and police and fire departments. General Tips    Ask your health care provider for a referral to a local prenatal education class. Begin classes no later than at the beginning of month 6 of your pregnancy.  Ask for help if you have counseling or nutritional needs during pregnancy. Your health care provider can offer advice or refer you to specialists for help with various needs.  Do not use hot tubs, steam rooms, or saunas.  Do not douche or use tampons or scented sanitary pads.  Do not cross your legs for long periods of time.  Avoid cat litter boxes and soil used by cats. These carry germs that can cause birth defects in the baby and possibly loss of the fetus by miscarriage or stillbirth.  Avoid all smoking, herbs, alcohol, and medicines not prescribed by your health care provider. Chemicals in these affect the formation and growth of the baby.  Schedule a dentist appointment. At home, brush your teeth with a soft toothbrush and be gentle when you floss. SEEK MEDICAL CARE IF:   You have  dizziness.  You have mild pelvic cramps, pelvic pressure, or nagging pain in the abdominal area.  You have persistent nausea, vomiting, or diarrhea.  You have a bad smelling vaginal discharge.  You have pain with urination.  You notice increased swelling in your face, hands, legs, or ankles. SEEK IMMEDIATE MEDICAL CARE IF:   You have a fever.  You are leaking fluid from your vagina.  You have spotting or bleeding from your vagina.  You have severe abdominal cramping or pain.  You have rapid weight gain or loss.  You vomit blood or material that looks like coffee grounds.  You are exposed to German measles and have never had them.  You are exposed to fifth disease or chickenpox.  You develop a severe headache.  You have shortness of breath.  You have any kind of trauma, such as from a fall or a car accident. Document Released: 05/17/2001 Document Revised: 10/07/2013 Document Reviewed: 04/02/2013 [employer] Patient Information 2015 [employer]. This information is not intended to replace advice given to you by your health care provider. Make sure you discuss any questions you have with your health care provider.  

## 2014-12-18 NOTE — Progress Notes (Signed)
Pt left without having labs drawn. Will draw at next visit.

## 2014-12-18 NOTE — Progress Notes (Signed)
Bedside US for viability = Single IUP, FHR - 170 bpm per PW doppler, CRL = 1.66 cm ([redacted]w[redacted]d)  Dr. Roselie Awkward notified of results

## 2014-12-18 NOTE — Progress Notes (Signed)
New ob packet given   

## 2014-12-19 ENCOUNTER — Encounter: Payer: Self-pay | Admitting: Obstetrics & Gynecology

## 2014-12-19 LAB — GC/CHLAMYDIA PROBE AMP
CT Probe RNA: NEGATIVE
GC PROBE AMP APTIMA: NEGATIVE

## 2014-12-21 LAB — CULTURE, OB URINE: Colony Count: 75000

## 2014-12-24 LAB — METHADONE (GC/LC/MS), URINE
EDDP (GC/LC/MS), ur confirm: 47794 ng/mL — AB (ref <100)
METHADONEUC: 46185 ng/mL — AB (ref <100)

## 2014-12-25 LAB — PRESCRIPTION MONITORING PROFILE (19 PANEL)
AMPHETAMINE/METH: NEGATIVE ng/mL
BARBITURATE SCREEN, URINE: NEGATIVE ng/mL
Benzodiazepine Screen, Urine: NEGATIVE ng/mL
Buprenorphine, Urine: NEGATIVE ng/mL
Cannabinoid Scrn, Ur: NEGATIVE ng/mL
Carisoprodol, Urine: NEGATIVE ng/mL
Cocaine Metabolites: NEGATIVE ng/mL
Creatinine, Urine: 184.59 mg/dL (ref >20.0)
ECSTASY: NEGATIVE ng/mL
FENTANYL URINE: NEGATIVE ng/mL
METHAQUALONE SCREEN (URINE): NEGATIVE ng/mL
Meperidine, Ur: NEGATIVE ng/mL
NITRITES URINE, INITIAL: NEGATIVE ug/mL
OPIATE SCREEN, URINE: NEGATIVE ng/mL
OXYCODONE SCRN UR: NEGATIVE ng/mL
PH URINE, INITIAL: 5.5 pH (ref 4.5–8.9)
PROPOXYPHENE: NEGATIVE ng/mL
Phencyclidine, Ur: NEGATIVE ng/mL
TAPENTADOLUR: NEGATIVE ng/mL
TRAMADOL UR: NEGATIVE ng/mL
Zolpidem, Urine: NEGATIVE ng/mL

## 2015-01-14 ENCOUNTER — Ambulatory Visit (HOSPITAL_COMMUNITY)
Admission: RE | Admit: 2015-01-14 | Discharge: 2015-01-14 | Disposition: A | Discharge status: Home / Self Care | Payer: Medicaid Other | Source: Ambulatory Visit | Attending: Obstetrics & Gynecology | Admitting: Obstetrics & Gynecology

## 2015-01-14 ENCOUNTER — Encounter (HOSPITAL_COMMUNITY): Payer: Self-pay

## 2015-01-14 ENCOUNTER — Other Ambulatory Visit (HOSPITAL_COMMUNITY): Payer: Self-pay | Admitting: *Deleted

## 2015-01-14 DIAGNOSIS — Z3A18 18 weeks gestation of pregnancy: Secondary | ICD-10-CM

## 2015-01-14 DIAGNOSIS — F1120 Opioid dependence, uncomplicated: Secondary | ICD-10-CM

## 2015-01-14 DIAGNOSIS — O0971 Supervision of high risk pregnancy due to social problems, first trimester: Secondary | ICD-10-CM | POA: Diagnosis not present

## 2015-01-14 DIAGNOSIS — O99320 Drug use complicating pregnancy, unspecified trimester: Secondary | ICD-10-CM | POA: Insufficient documentation

## 2015-01-14 DIAGNOSIS — Z3689 Encounter for other specified antenatal screening: Secondary | ICD-10-CM

## 2015-01-14 NOTE — ED Notes (Signed)
Pt states she has been having some constipation.  She has an appointment with her OB tomorrow and will address this issue then.

## 2015-01-15 ENCOUNTER — Ambulatory Visit (INDEPENDENT_AMBULATORY_CARE_PROVIDER_SITE_OTHER): Payer: Medicaid Other | Admitting: Family Medicine

## 2015-01-15 VITALS — BP 124/83 | HR 71 | Temp 98.2°F | Wt 153.7 lb

## 2015-01-15 DIAGNOSIS — O0971 Supervision of high risk pregnancy due to social problems, first trimester: Secondary | ICD-10-CM

## 2015-01-15 DIAGNOSIS — F1120 Opioid dependence, uncomplicated: Secondary | ICD-10-CM

## 2015-01-15 DIAGNOSIS — O99320 Drug use complicating pregnancy, unspecified trimester: Secondary | ICD-10-CM

## 2015-01-15 DIAGNOSIS — K5909 Other constipation: Secondary | ICD-10-CM

## 2015-01-15 MED ORDER — SENNOSIDES-DOCUSATE SODIUM 8.6-50 MG PO TABS: 1.0000 | ORAL_TABLET | Freq: Two times a day (BID) | ORAL | Status: DC

## 2015-01-15 NOTE — Progress Notes (Signed)
Subjective:  Rebecca Hancock is a 34 y.o. G1P0 at [redacted]w[redacted]d being seen today for ongoing prenatal care.  Patient reports nausea.  Contractions: Not present.  Vag. Bleeding: None.  . Denies leaking of fluid.   The following portions of the patient's history were reviewed and updated as appropriate: allergies, current medications, past family history, past medical history, past social history, past surgical history and problem list.   Objective:   Filed Vitals:   01/15/15 1024  BP: 124/83  Pulse: 71  Temp: 98.2 F (36.8 C)  Weight: 153 lb 11.2 oz (69.718 kg)    Fetal Status: Fetal Heart Rate (bpm): 152         General:  Alert, oriented and cooperative. Patient is in no acute distress.  Skin: Skin is warm and dry. No rash noted.   Cardiovascular: Normal heart rate noted  Respiratory: Normal respiratory effort, no problems with respiration noted  Abdomen: Soft, gravid, appropriate for gestational age. Pain/Pressure: Present     Pelvic: Vag. Bleeding: None      Extremities: Normal range of motion.  Edema: None  Mental Status: Normal mood and affect. Normal behavior. Normal judgment and thought content.    Assessment and Plan:  Pregnancy: G1P0 at [redacted]w[redacted]d  1. Supervision of high risk pregnancy due to social problems, antepartum, first trimester Needs Prenatal labs S/p First screen with MFM - Prenatal Profile  2. Methadone maintenance treatment affecting pregnancy, antepartum Titrating up  3. 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  Dispense: 60 tablet; Refill: 5  Please refer to After Visit Summary for other counseling recommendations.  Return in 4 weeks (on 02/12/2015).   Donnamae Jude, MD

## 2015-01-15 NOTE — Progress Notes (Signed)
Patient reports pain bilaterally in groin  Patient reports ongoing problems with constipation and insomnia

## 2015-01-15 NOTE — Patient Instructions (Signed)
Second Trimester of Pregnancy The second trimester is from week 13 through week 28, months 4 through 6. The second trimester is often a time when you feel your best. Your body has also adjusted to being pregnant, and you begin to feel better physically. Usually, morning sickness has lessened or quit completely, you may have more energy, and you may have an increase in appetite. The second trimester is also a time when the fetus is growing rapidly. At the end of the sixth month, the fetus is about 9 inches long and weighs about 1 pounds. You will likely begin to feel the baby move (quickening) between 18 and 20 weeks of the pregnancy. BODY CHANGES Your body goes through many changes during pregnancy. The changes vary from woman to woman.   Your weight will continue to increase. You will notice your lower abdomen bulging out.  You may begin to get stretch marks on your hips, abdomen, and breasts.  You may develop headaches that can be relieved by medicines approved by your health care provider.  You may urinate more often because the fetus is pressing on your bladder.  You may develop or continue to have heartburn as a result of your pregnancy.  You may develop constipation because certain hormones are causing the muscles that push waste through your intestines to slow down.  You may develop hemorrhoids or swollen, bulging veins (varicose veins).  You may have back pain because of the weight gain and pregnancy hormones relaxing your joints between the bones in your pelvis and as a result of a shift in weight and the muscles that support your balance.  Your breasts will continue to grow and be tender.  Your gums may bleed and may be sensitive to brushing and flossing.  Dark spots or blotches (chloasma, mask of pregnancy) may develop on your face. This will likely fade after the baby is born.  A dark line from your belly button to the pubic area (linea nigra) may appear. This will likely  fade after the baby is born.  You may have changes in your hair. These can include thickening of your hair, rapid growth, and changes in texture. Some women also have hair loss during or after pregnancy, or hair that feels dry or thin. Your hair will most likely return to normal after your baby is born. WHAT TO EXPECT AT YOUR PRENATAL VISITS During a routine prenatal visit:  You will be weighed to make sure you and the fetus are growing normally.  Your blood pressure will be taken.  Your abdomen will be measured to track your baby's growth.  The fetal heartbeat will be listened to.  Any test results from the previous visit will be discussed. Your health care provider may ask you:  How you are feeling.  If you are feeling the baby move.  If you have had any abnormal symptoms, such as leaking fluid, bleeding, severe headaches, or abdominal cramping.  If you have any questions. Other tests that may be performed during your second trimester include:  Blood tests that check for:  Low iron levels (anemia).  Gestational diabetes (between 24 and 28 weeks).  Rh antibodies.  Urine tests to check for infections, diabetes, or protein in the urine.  An ultrasound to confirm the proper growth and development of the baby.  An amniocentesis to check for possible genetic problems.  Fetal screens for spina bifida and Down syndrome. HOME CARE INSTRUCTIONS   Avoid all smoking, herbs, alcohol, and unprescribed   drugs. These chemicals affect the formation and growth of the baby.  Follow your health care provider's instructions regarding medicine use. There are medicines that are either safe or unsafe to take during pregnancy.  Exercise only as directed by your health care provider. Experiencing uterine cramps is a good sign to stop exercising.  Continue to eat regular, healthy meals.  Wear a good support bra for breast tenderness.  Do not use hot tubs, steam rooms, or saunas.  Wear  your seat belt at all times when driving.  Avoid raw meat, uncooked cheese, cat litter boxes, and soil used by cats. These carry germs that can cause birth defects in the baby.  Take your prenatal vitamins.  Try taking a stool softener (if your health care provider approves) if you develop constipation. Eat more high-fiber foods, such as fresh vegetables or fruit and whole grains. Drink plenty of fluids to keep your urine clear or pale yellow.  Take warm sitz baths to soothe any pain or discomfort caused by hemorrhoids. Use hemorrhoid cream if your health care provider approves.  If you develop varicose veins, wear support hose. Elevate your feet for 15 minutes, 3-4 times a day. Limit salt in your diet.  Avoid heavy lifting, wear low heel shoes, and practice good posture.  Rest with your legs elevated if you have leg cramps or low back pain.  Visit your dentist if you have not gone yet during your pregnancy. Use a soft toothbrush to brush your teeth and be gentle when you floss.  A sexual relationship may be continued unless your health care provider directs you otherwise.  Continue to go to all your prenatal visits as directed by your health care provider. SEEK MEDICAL CARE IF:   You have dizziness.  You have mild pelvic cramps, pelvic pressure, or nagging pain in the abdominal area.  You have persistent nausea, vomiting, or diarrhea.  You have a bad smelling vaginal discharge.  You have pain with urination. SEEK IMMEDIATE MEDICAL CARE IF:   You have a fever.  You are leaking fluid from your vagina.  You have spotting or bleeding from your vagina.  You have severe abdominal cramping or pain.  You have rapid weight gain or loss.  You have shortness of breath with chest pain.  You notice sudden or extreme swelling of your face, hands, ankles, feet, or legs.  You have not felt your baby move in over an hour.  You have severe headaches that do not go away with  medicine.  You have vision changes. Document Released: 05/17/2001 Document Revised: 05/28/2013 Document Reviewed: 07/24/2012 [employer] Patient Information 2015 [employer]. This information is not intended to replace advice given to you by your health care provider. Make sure you discuss any questions you have with your health care provider.  Breastfeeding Deciding to breastfeed is one of the best choices you can make for you and your baby. A change in hormones during pregnancy causes your breast tissue to grow and increases the number and size of your milk ducts. These hormones also allow proteins, sugars, and fats from your blood supply to make breast milk in your milk-producing glands. Hormones prevent breast milk from being released before your baby is born as well as prompt milk flow after birth. Once breastfeeding has begun, thoughts of your baby, as well as his or her sucking or crying, can stimulate the release of milk from your milk-producing glands.  BENEFITS OF BREASTFEEDING For Your Baby  Your first   milk (colostrum) helps your baby's digestive system function better.   There are antibodies in your milk that help your baby fight off infections.   Your baby has a lower incidence of asthma, allergies, and sudden infant death syndrome.   The nutrients in breast milk are better for your baby than infant formulas and are designed uniquely for your baby's needs.   Breast milk improves your baby's brain development.   Your baby is less likely to develop other conditions, such as childhood obesity, asthma, or type 2 diabetes mellitus.  For You   Breastfeeding helps to create a very special bond between you and your baby.   Breastfeeding is convenient. Breast milk is always available at the correct temperature and costs nothing.   Breastfeeding helps to burn calories and helps you lose the weight gained during pregnancy.   Breastfeeding makes your uterus contract to its  prepregnancy size faster and slows bleeding (lochia) after you give birth.   Breastfeeding helps to lower your risk of developing type 2 diabetes mellitus, osteoporosis, and breast or ovarian cancer later in life. SIGNS THAT YOUR BABY IS HUNGRY Early Signs of Hunger  Increased alertness or activity.  Stretching.  Movement of the head from side to side.  Movement of the head and opening of the mouth when the corner of the mouth or cheek is stroked (rooting).  Increased sucking sounds, smacking lips, cooing, sighing, or squeaking.  Hand-to-mouth movements.  Increased sucking of fingers or hands. Late Signs of Hunger  Fussing.  Intermittent crying. Extreme Signs of Hunger Signs of extreme hunger will require calming and consoling before your baby will be able to breastfeed successfully. Do not wait for the following signs of extreme hunger to occur before you initiate breastfeeding:   Restlessness.  A loud, strong cry.   Screaming. BREASTFEEDING BASICS Breastfeeding Initiation  Find a comfortable place to sit or lie down, with your neck and back well supported.  Place a pillow or rolled up blanket under your baby to bring him or her to the level of your breast (if you are seated). Nursing pillows are specially designed to help support your arms and your baby while you breastfeed.  Make sure that your baby's abdomen is facing your abdomen.   Gently massage your breast. With your fingertips, massage from your chest wall toward your nipple in a circular motion. This encourages milk flow. You may need to continue this action during the feeding if your milk flows slowly.  Support your breast with 4 fingers underneath and your thumb above your nipple. Make sure your fingers are well away from your nipple and your baby's mouth.   Stroke your baby's lips gently with your finger or nipple.   When your baby's mouth is open wide enough, quickly bring your baby to your breast,  placing your entire nipple and as much of the colored area around your nipple (areola) as possible into your baby's mouth.   More areola should be visible above your baby's upper lip than below the lower lip.   Your baby's tongue should be between his or her lower gum and your breast.   Ensure that your baby's mouth is correctly positioned around your nipple (latched). Your baby's lips should create a seal on your breast and be turned out (everted).  It is common for your baby to suck about 2-3 minutes in order to start the flow of breast milk. Latching Teaching your baby how to latch on to your breast   properly is very important. An improper latch can cause nipple pain and decreased milk supply for you and poor weight gain in your baby. Also, if your baby is not latched onto your nipple properly, he or she may swallow some air during feeding. This can make your baby fussy. Burping your baby when you switch breasts during the feeding can help to get rid of the air. However, teaching your baby to latch on properly is still the best way to prevent fussiness from swallowing air while breastfeeding. Signs that your baby has successfully latched on to your nipple:    Silent tugging or silent sucking, without causing you pain.   Swallowing heard between every 3-4 sucks.    Muscle movement above and in front of his or her ears while sucking.  Signs that your baby has not successfully latched on to nipple:   Sucking sounds or smacking sounds from your baby while breastfeeding.  Nipple pain. If you think your baby has not latched on correctly, slip your finger into the corner of your baby's mouth to break the suction and place it between your baby's gums. Attempt breastfeeding initiation again. Signs of Successful Breastfeeding Signs from your baby:   A gradual decrease in the number of sucks or complete cessation of sucking.   Falling asleep.   Relaxation of his or her body.    Retention of a small amount of milk in his or her mouth.   Letting go of your breast by himself or herself. Signs from you:  Breasts that have increased in firmness, weight, and size 1-3 hours after feeding.   Breasts that are softer immediately after breastfeeding.  Increased milk volume, as well as a change in milk consistency and color by the fifth day of breastfeeding.   Nipples that are not sore, cracked, or bleeding. Signs That Your Baby is Getting Enough Milk  Wetting at least 3 diapers in a 24-hour period. The urine should be clear and pale yellow by age 5 days.  At least 3 stools in a 24-hour period by age 5 days. The stool should be soft and yellow.  At least 3 stools in a 24-hour period by age 7 days. The stool should be seedy and yellow.  No loss of weight greater than 10% of birth weight during the first 3 days of age.  Average weight gain of 4-7 ounces (113-198 g) per week after age 4 days.  Consistent daily weight gain by age 5 days, without weight loss after the age of 2 weeks. After a feeding, your baby may spit up a small amount. This is common. BREASTFEEDING FREQUENCY AND DURATION Frequent feeding will help you make more milk and can prevent sore nipples and breast engorgement. Breastfeed when you feel the need to reduce the fullness of your breasts or when your baby shows signs of hunger. This is called "breastfeeding on demand." Avoid introducing a pacifier to your baby while you are working to establish breastfeeding (the first 4-6 weeks after your baby is born). After this time you may choose to use a pacifier. Research has shown that pacifier use during the first year of a baby's life decreases the risk of sudden infant death syndrome (SIDS). Allow your baby to feed on each breast as long as he or she wants. Breastfeed until your baby is finished feeding. When your baby unlatches or falls asleep while feeding from the first breast, offer the second breast.  Because newborns are often sleepy in the   first few weeks of life, you may need to awaken your baby to get him or her to feed. Breastfeeding times will vary from baby to baby. However, the following rules can serve as a guide to help you ensure that your baby is properly fed:  Newborns (babies 4 weeks of age or younger) may breastfeed every 1-3 hours.  Newborns should not go longer than 3 hours during the day or 5 hours during the night without breastfeeding.  You should breastfeed your baby a minimum of 8 times in a 24-hour period until you begin to introduce solid foods to your baby at around 6 months of age. BREAST MILK PUMPING Pumping and storing breast milk allows you to ensure that your baby is exclusively fed your breast milk, even at times when you are unable to breastfeed. This is especially important if you are going back to work while you are still breastfeeding or when you are not able to be present during feedings. Your lactation consultant can give you guidelines on how long it is safe to store breast milk.  A breast pump is a machine that allows you to pump milk from your breast into a sterile bottle. The pumped breast milk can then be stored in a refrigerator or freezer. Some breast pumps are operated by hand, while others use electricity. Ask your lactation consultant which type will work best for you. Breast pumps can be purchased, but some hospitals and breastfeeding support groups lease breast pumps on a monthly basis. A lactation consultant can teach you how to hand express breast milk, if you prefer not to use a pump.  CARING FOR YOUR BREASTS WHILE YOU BREASTFEED Nipples can become dry, cracked, and sore while breastfeeding. The following recommendations can help keep your breasts moisturized and healthy:  Avoid using soap on your nipples.   Wear a supportive bra. Although not required, special nursing bras and tank tops are designed to allow access to your breasts for  breastfeeding without taking off your entire bra or top. Avoid wearing underwire-style bras or extremely tight bras.  Air dry your nipples for 3-4minutes after each feeding.   Use only cotton bra pads to absorb leaked breast milk. Leaking of breast milk between feedings is normal.   Use lanolin on your nipples after breastfeeding. Lanolin helps to maintain your skin's normal moisture barrier. If you use pure lanolin, you do not need to wash it off before feeding your baby again. Pure lanolin is not toxic to your baby. You may also hand express a few drops of breast milk and gently massage that milk into your nipples and allow the milk to air dry. In the first few weeks after giving birth, some women experience extremely full breasts (engorgement). Engorgement can make your breasts feel heavy, warm, and tender to the touch. Engorgement peaks within 3-5 days after you give birth. The following recommendations can help ease engorgement:  Completely empty your breasts while breastfeeding or pumping. You may want to start by applying warm, moist heat (in the shower or with warm water-soaked hand towels) just before feeding or pumping. This increases circulation and helps the milk flow. If your baby does not completely empty your breasts while breastfeeding, pump any extra milk after he or she is finished.  Wear a snug bra (nursing or regular) or tank top for 1-2 days to signal your body to slightly decrease milk production.  Apply ice packs to your breasts, unless this is too uncomfortable for you.    Make sure that your baby is latched on and positioned properly while breastfeeding. If engorgement persists after 48 hours of following these recommendations, contact your health care provider or a lactation consultant. OVERALL HEALTH CARE RECOMMENDATIONS WHILE BREASTFEEDING  Eat healthy foods. Alternate between meals and snacks, eating 3 of each per day. Because what you eat affects your breast milk,  some of the foods may make your baby more irritable than usual. Avoid eating these foods if you are sure that they are negatively affecting your baby.  Drink milk, fruit juice, and water to satisfy your thirst (about 10 glasses a day).   Rest often, relax, and continue to take your prenatal vitamins to prevent fatigue, stress, and anemia.  Continue breast self-awareness checks.  Avoid chewing and smoking tobacco.  Avoid alcohol and drug use. Some medicines that may be harmful to your baby can pass through breast milk. It is important to ask your health care provider before taking any medicine, including all over-the-counter and prescription medicine as well as vitamin and herbal supplements. It is possible to become pregnant while breastfeeding. If birth control is desired, ask your health care provider about options that will be safe for your baby. SEEK MEDICAL CARE IF:   You feel like you want to stop breastfeeding or have become frustrated with breastfeeding.  You have painful breasts or nipples.  Your nipples are cracked or bleeding.  Your breasts are red, tender, or warm.  You have a swollen area on either breast.  You have a fever or chills.  You have nausea or vomiting.  You have drainage other than breast milk from your nipples.  Your breasts do not become full before feedings by the fifth day after you give birth.  You feel sad and depressed.  Your baby is too sleepy to eat well.  Your baby is having trouble sleeping.   Your baby is wetting less than 3 diapers in a 24-hour period.  Your baby has less than 3 stools in a 24-hour period.  Your baby's skin or the white part of his or her eyes becomes yellow.   Your baby is not gaining weight by 5 days of age. SEEK IMMEDIATE MEDICAL CARE IF:   Your baby is overly tired (lethargic) and does not want to wake up and feed.  Your baby develops an unexplained fever. Document Released: 05/23/2005 Document Revised:  05/28/2013 Document Reviewed: 11/14/2012 [employer] Patient Information 2015 [employer]. This information is not intended to replace advice given to you by your health care provider. Make sure you discuss any questions you have with your health care provider.  

## 2015-01-16 LAB — PRENATAL PROFILE (SOLSTAS)
ANTIBODY SCREEN: NEGATIVE
BASOS ABS: 0.1 10*3/uL (ref 0.0–0.1)
BASOS PCT: 1 % (ref 0–1)
Eosinophils Absolute: 0.2 10*3/uL (ref 0.0–0.7)
Eosinophils Relative: 3 % (ref 0–5)
HCT: 41.7 % (ref 36.0–46.0)
HIV: NONREACTIVE
Hemoglobin: 14 g/dL (ref 12.0–15.0)
Hepatitis B Surface Ag: NEGATIVE
Lymphocytes Relative: 21 % (ref 12–46)
Lymphs Abs: 1.7 10*3/uL (ref 0.7–4.0)
MCH: 29.7 pg (ref 26.0–34.0)
MCHC: 33.6 g/dL (ref 30.0–36.0)
MCV: 88.5 fL (ref 78.0–100.0)
MONO ABS: 0.5 10*3/uL (ref 0.1–1.0)
MPV: 9.8 fL (ref 8.6–12.4)
Monocytes Relative: 6 % (ref 3–12)
NEUTROS ABS: 5.7 10*3/uL (ref 1.7–7.7)
NEUTROS PCT: 69 % (ref 43–77)
PLATELETS: 324 10*3/uL (ref 150–400)
RBC: 4.71 MIL/uL (ref 3.87–5.11)
RDW: 13.8 % (ref 11.5–15.5)
RH TYPE: POSITIVE
Rubella: 5.11 Index — ABNORMAL HIGH (ref <0.90)
WBC: 8.3 10*3/uL (ref 4.0–10.5)

## 2015-01-23 ENCOUNTER — Other Ambulatory Visit (HOSPITAL_COMMUNITY): Payer: Self-pay | Admitting: Obstetrics & Gynecology

## 2015-01-28 ENCOUNTER — Telehealth: Payer: Self-pay | Admitting: *Deleted

## 2015-01-28 DIAGNOSIS — Z3492 Encounter for supervision of normal pregnancy, unspecified, second trimester: Secondary | ICD-10-CM

## 2015-01-28 MED ORDER — PRENATAL VITAMINS 0.8 MG PO TABS: 1.0000 | ORAL_TABLET | Freq: Every day | ORAL | Status: DC

## 2015-01-28 NOTE — Telephone Encounter (Signed)
Pt contacted clinic to inquire about where to get Prenatal vitamins.    Contacted patient, prenatal vitamins ordered.  Pt verbalizes understanding.

## 2015-02-12 ENCOUNTER — Ambulatory Visit (INDEPENDENT_AMBULATORY_CARE_PROVIDER_SITE_OTHER): Payer: Medicaid Other | Admitting: Obstetrics & Gynecology

## 2015-02-12 VITALS — BP 129/74 | HR 69 | Temp 97.9°F | Wt 159.0 lb

## 2015-02-12 DIAGNOSIS — F1120 Opioid dependence, uncomplicated: Secondary | ICD-10-CM | POA: Diagnosis not present

## 2015-02-12 DIAGNOSIS — O99320 Drug use complicating pregnancy, unspecified trimester: Principal | ICD-10-CM

## 2015-02-12 DIAGNOSIS — O99322 Drug use complicating pregnancy, second trimester: Secondary | ICD-10-CM | POA: Diagnosis present

## 2015-02-12 DIAGNOSIS — O0972 Supervision of high risk pregnancy due to social problems, second trimester: Secondary | ICD-10-CM | POA: Diagnosis not present

## 2015-02-12 DIAGNOSIS — R8290 Unspecified abnormal findings in urine: Secondary | ICD-10-CM | POA: Diagnosis not present

## 2015-02-12 LAB — POCT URINALYSIS DIP (DEVICE)
BILIRUBIN URINE: NEGATIVE
Glucose, UA: NEGATIVE mg/dL
Ketones, ur: NEGATIVE mg/dL
NITRITE: NEGATIVE
PH: 6.5 (ref 5.0–8.0)
Protein, ur: NEGATIVE mg/dL
SPECIFIC GRAVITY, URINE: 1.02 (ref 1.005–1.030)
Urobilinogen, UA: 0.2 mg/dL (ref 0.0–1.0)

## 2015-02-12 NOTE — Progress Notes (Signed)
Subjective:  Rebecca Hancock is a 34 y.o. G1P0 at [redacted]w[redacted]d being seen today for ongoing prenatal care.  Patient reports bleeding after intercourse, none currently.  Contractions: Not present.  Vag. Bleeding: Scant. Movement: Absent. Denies leaking of fluid.   The following portions of the patient's history were reviewed and updated as appropriate: allergies, current medications, past family history, past medical history, past social history, past surgical history and problem list.   Objective:   Filed Vitals:   02/12/15 1136  BP: 129/74  Pulse: 69  Temp: 97.9 F (36.6 C)  Weight: 159 lb (72.122 kg)    Fetal Status: Fetal Heart Rate (bpm): 138   Movement: Absent     General:  Alert, oriented and cooperative. Patient is in no acute distress.  Skin: Skin is warm and dry. No rash noted.   Cardiovascular: Normal heart rate noted  Respiratory: Normal respiratory effort, no problems with respiration noted  Abdomen: Soft, gravid, appropriate for gestational age. Pain/Pressure: Present     Pelvic: Vag. Bleeding: Scant    Cervical exam deferred        Extremities: Normal range of motion.  Edema: None  Mental Status: Normal mood and affect. Normal behavior. Normal judgment and thought content.   Urinalysis: Urine Protein: Negative Urine Glucose: Negative  Assessment and Plan:  Pregnancy: G1P0 at [redacted]w[redacted]d  1. Methadone maintenance treatment affecting pregnancy, antepartum Continue with ADS  2. Abnormal urinalysis  Asymptomatic - Culture, OB Urine; Future  3. Supervision of high risk pregnancy due to social problems, antepartum, second trimester Normal first screen, AFP only today. Anatomy scan scheduled. - Alpha fetoprotein, maternal  Routine obstetric precautions reviewed. Please refer to After Visit Summary for other counseling recommendations.  Return in about 4 weeks (around 03/12/2015) for OB Visit.   Osborne Oman, MD

## 2015-02-12 NOTE — Patient Instructions (Signed)
Return to clinic for any obstetric concerns or go to [facility] for evaluation  

## 2015-02-12 NOTE — Progress Notes (Signed)
Breastfeeding tip of the week reviewed Delay flu till next visit Moderate hgb, trach leukocytes in urine

## 2015-02-13 LAB — ALPHA FETOPROTEIN, MATERNAL
AFP: 32.3 ng/mL
Curr Gest Age: 16.3 wks.days
MOM FOR AFP: 0.94
OPEN SPINA BIFIDA: NEGATIVE

## 2015-02-25 ENCOUNTER — Ambulatory Visit (HOSPITAL_COMMUNITY)
Admission: RE | Admit: 2015-02-25 | Discharge: 2015-02-25 | Disposition: A | Discharge status: Home / Self Care | Payer: Medicaid Other | Source: Ambulatory Visit | Attending: Obstetrics & Gynecology | Admitting: Obstetrics & Gynecology

## 2015-02-25 ENCOUNTER — Other Ambulatory Visit (HOSPITAL_COMMUNITY): Payer: Self-pay | Admitting: Maternal and Fetal Medicine

## 2015-02-25 ENCOUNTER — Other Ambulatory Visit (HOSPITAL_COMMUNITY): Payer: Self-pay | Admitting: *Deleted

## 2015-02-25 DIAGNOSIS — O4440 Low lying placenta NOS or without hemorrhage, unspecified trimester: Secondary | ICD-10-CM

## 2015-02-25 DIAGNOSIS — Z36 Encounter for antenatal screening of mother: Secondary | ICD-10-CM | POA: Diagnosis not present

## 2015-02-25 DIAGNOSIS — Z3A18 18 weeks gestation of pregnancy: Secondary | ICD-10-CM | POA: Insufficient documentation

## 2015-02-25 DIAGNOSIS — Z3689 Encounter for other specified antenatal screening: Secondary | ICD-10-CM

## 2015-03-07 ENCOUNTER — Emergency Department: Payer: Medicaid - Out of State

## 2015-03-07 ENCOUNTER — Emergency Department
Admission: EM | Admit: 2015-03-07 | Discharge: 2015-03-07 | Disposition: A | Discharge status: Home / Self Care | Payer: Medicaid - Out of State | Attending: Emergency Medicine | Admitting: Emergency Medicine

## 2015-03-07 DIAGNOSIS — O9989 Other specified diseases and conditions complicating pregnancy, childbirth and the puerperium: Secondary | ICD-10-CM | POA: Insufficient documentation

## 2015-03-07 DIAGNOSIS — Z760 Encounter for issue of repeat prescription: Secondary | ICD-10-CM | POA: Insufficient documentation

## 2015-03-07 NOTE — Discharge Instructions (Signed)
Return to ED for nausea, vomiting, fevers, chills, chest pain, shortness of breath, abdominal pain, headaches, neck pain, back pain, weakness, numbness, worsening pain, worsening symptoms, unable to follow up with PMD/Specialist as directed    Medication Refill    You have been given a refill of one or more of your regular medicines.    In emergency situations, the doctor can give you a temporary refill for the medicines you need. However, you need to follow up with your regular doctor for future refills.    Some medicines can cause significant symptoms when stopped suddenly. Therefore, it is important to make plans for getting refills before you run out of medicines.    Return here or go to the nearest Emergency Department if:   You need an emergency refill on your medications and you cannot get in touch with your doctor.

## 2015-03-07 NOTE — ED Notes (Signed)
Breathing unlabored and even. Skin warm dry and pink. Pt alert and oriented. States she needs methadone refill. Pt 6 months pregnant-denies any issues with pregnancy at this time.

## 2015-03-07 NOTE — ED Provider Notes (Signed)
EMERGENCY DEPARTMENT NOTE    Physician/Midlevel provider first contact with patient: 03/07/15 5573         Dr Karle Plumber  is the primary attending for this patient and has obtained and performed the history, PE, and medical decision making for this patient.      MEDICAL DECISION MAKING/ED COURSE     --Pt seen and examined immediately on arrival to the ED;   --Patient sent here from methadone clinic for her to get her methadone dose for today  --I have explained to the patient and to the clinic staff that we do not dispense methadone from the emergency room and the patient is in rehabilitation , hence the clinic or the medical director for the clinic should be able to get her her methadone dose for today  --patient is quite understanding of the situation    --Patient is clearly not having any withdrawal symptoms at this time in the ED;  -Reports that she has had withdrawal in the past and quite familiar with the signs and symptoms of withdrawal; he agrees that she is clearly not having any withdrawal symptoms  --FHR-150's and no OB related complaints at this time      10:49 AM-Entire case d/w OB Hospitalist at [facility] (Dr Su Hilt) and agrees that if the patient is not in withdrawal, then no danger to the fetus at this time;     10:55 AM-Entire case d/w Anesthesia at [facility] and they would not be able to assist with outpt methadone treatment;     11:05 AM--Several calls placed to Methadone Clinic Manager and had to leave a voice mail;  11:10 AM--Entire case d/w Dr Lyn Hollingshead [phone])-medical director for the [facility] with ED management and they will try to follow up with the patient;    11:18 AM--Pt wants to go home; States that she feels fine and no medical complaints; Does not want any other narcotics to substitute for the methadone   At discharge, pt looked well, nontoxic, no distress, and is good candidate for outpatient follow up. No signs of toxicity to suggests need for further labs,  imaging or for admission.    Results discussed w/ patient/family.  All questions were answered.   Pt is ambulatory on d/c without difficulty. Good PO.    Patient counseled extensively   Return precautions given   Patient urged to follow-up with the methadone clinic tomorrow morning      MDM  Number of Diagnoses or Management Options  Medication refill:   Diagnosis management comments: Differential diagnosis: Medication refill, methadone dosing       Amount and/or Complexity of Data Reviewed  Discuss the patient with other providers: yes    Risk of Complications, Morbidity, and/or Mortality  Presenting problems: moderate  Diagnostic procedures: minimal  Management options: moderate          Procedures        Diagnosis:  Final diagnoses:   Medication refill       Disposition:  ED Disposition     Discharge Gail Dixon discharge to home/self care.    Condition at disposition: Stable            Prescriptions:  Discharge Medication List as of 03/07/2015 11:28 AM      CONTINUE these medications which have NOT CHANGED    Details   methadone (DOLOPHINE) 10 MG tablet Take 87 mg by mouth., Until Discontinued, Historical Med      Prenatal w/o A Vit-Fe Fum-FA (PRENATA  PO) Take by mouth., Until Discontinued, Historical Med               HISTORY OF PRESENT ILLNESS     Translator Used: No    Chief Complaint: Medication Refill     Mechanism of Injury:  None    Gail Dixon is a 34 y.o. female presents to the ED with Chief Complaint: Medication refill;  patient reports that she is visiting from [place] and has been on methadone for the last 7 months; patient visiting the [place] area and hence set up "guest services"at the local methadone clinic.  Patient has been going to the local methadone clinic for last 3 days to get her daily dose of methadone.  This morning, patient caught the clinic approximately 15-20 minutes late and clinic could not give her the methadone dose for today;  clinic sent the patient to the ER to  receive her methadone dose;  last dose was at 9:30 AM on September 30; patient takes 87 mg once a day;    --> Patient has no complaints.  No nausea or vomiting.  No diarrhea.  No fevers or chills.  No abdominal pain or cramping.  No chest pain/pressure/heaviness/tightness; No SOB/trouble breathing; No Pleuritic pain or symptoms;  No LE pain or swelling;  patient reports that she is probably 6 months pregnant; EDC-06/26/15; patient denies any abdominal pain or cramping.  No vaginal bleeding or spotting.  No leakage of fluids.  No headaches.  No palpitations.  No dysuria or hematuria.  Denies diaphoresis.  Denies any anxiety.  Patient denies any withdrawal.      History obtained from: Patient.  Nursing notes from this date of service were reviewed.    PMD:   CLinic  ALL: No Known Allergies      PMH: Confirmed with the patient and updated as below  PSH: Confirmed with the patient and updated as below  Social History: Confirmed with the patient and updated as below      MEDICAL HISTORY     Past Medical History:  History reviewed. No pertinent past medical history.    Past Surgical History:  History reviewed. No pertinent past surgical history.    Social History:  Social History     Social History   . Marital Status: Single     Spouse Name: N/A   . Number of Children: N/A   . Years of Education: N/A     Occupational History   . Not on file.     Social History Main Topics   . Smoking status: Never Smoker    . Smokeless tobacco: Not on file   . Alcohol Use: Yes   . Drug Use: Not on file   . Sexual Activity: Not on file     Other Topics Concern   . Not on file     Social History Narrative   . No narrative on file       Family History:  No family history on file.    Outpatient Medication:  Discharge Medication List as of 03/07/2015 11:28 AM      CONTINUE these medications which have NOT CHANGED    Details   methadone (DOLOPHINE) 10 MG tablet Take 87 mg by mouth., Until Discontinued, Historical Med      Prenatal w/o A Vit-Fe  Fum-FA (PRENATA PO) Take by mouth., Until Discontinued, Historical Med               REVIEW OF SYSTEMS  Review of Systems   Constitutional: Negative for fever, chills, activity change and appetite change.   HENT: Negative for congestion, sore throat and trouble swallowing.    Respiratory: Negative for cough and shortness of breath.    Cardiovascular: Negative for chest pain, palpitations and leg swelling.   Gastrointestinal: Negative for nausea, vomiting, abdominal pain and diarrhea.   Genitourinary: Negative for dysuria, hematuria, flank pain, vaginal bleeding and vaginal discharge.   Musculoskeletal: Negative for back pain, gait problem and neck pain.   Skin: Negative for pallor and rash.   Neurological: Negative for dizziness, weakness, light-headedness and headaches.   All other systems reviewed and are negative.      PHYSICAL EXAM   ED Triage Vitals   Enc Vitals Group      BP 03/07/15 1002 142/97 mmHg      Heart Rate 03/07/15 1002 87      Resp Rate 03/07/15 1002 15      Temp 03/07/15 1002 97.8 F (36.6 C)      Temp Source 03/07/15 1002 Temporal Art      SpO2 03/07/15 1002 99 %      Weight 03/07/15 1002 72.576 kg      Height 03/07/15 1002 1.575 m      Head Cir --       Peak Flow --       Pain Score 03/07/15 1002 0      Pain Loc --       Pain Edu? --       Excl. in GC? --        Filed Vitals:    03/07/15 1002   BP: 142/97   Pulse: 87   Temp: 97.8 F (36.6 C)   TempSrc: Temporal Artery   Resp: 15   Height: 5\' 2"  (1.575 m)   Weight: 72.576 kg   SpO2: 99%       Physical Exam   Constitutional: She is oriented to person, place, and time and well-developed, well-nourished, and in no distress. No distress.   Awake and alert, NAD; Non-toxic; No resp distress;   Answers all questions appropriately     HENT:   Head: Normocephalic and atraumatic.   Mouth/Throat: Oropharynx is clear and moist.   Eyes: Conjunctivae and EOM are normal. Pupils are equal, round, and reactive to light.   Neck: Normal range of motion. Neck  supple.   Cardiovascular: Normal rate, regular rhythm, normal heart sounds and intact distal pulses.    No murmur heard.  Pulmonary/Chest: Effort normal and breath sounds normal. No stridor. No respiratory distress. She has no wheezes. She has no rales.   Abdominal: Soft. She exhibits no distension. There is no tenderness. There is no rebound and no guarding.   No CVA tenderness   Musculoskeletal: Normal range of motion. She exhibits no edema or tenderness.   No calf tenderness   Neurological: She is alert and oriented to person, place, and time. GCS score is 15.   Skin: Skin is warm and dry. No rash noted. She is not diaphoretic. No erythema. No pallor.   Psychiatric: Affect normal.   Nursing note and vitals reviewed.         DATA REVIEWED     Vital Signs: Reviewed the patient?s vital signs.   Nursing Notes: Reviewed and utilized available nursing notes.  Medical Records Reviewed: Reviewed available past medical records if available.         PULSE OXIMETRY    Oxygen Saturation by  Pulse Oximetry: 99%  On RA  indicating normal oxygenation;        EMERGENCY DEPT. MEDICATIONS      ED Medication Orders     None                  Horatio Pel, MD  03/08/15 1500

## 2015-03-12 ENCOUNTER — Other Ambulatory Visit (HOSPITAL_COMMUNITY)
Admission: RE | Admit: 2015-03-12 | Discharge: 2015-03-12 | Disposition: A | Discharge status: Home / Self Care | Payer: Medicaid Other | Source: Ambulatory Visit | Attending: Obstetrics & Gynecology | Admitting: Obstetrics & Gynecology

## 2015-03-12 ENCOUNTER — Ambulatory Visit (INDEPENDENT_AMBULATORY_CARE_PROVIDER_SITE_OTHER): Payer: Medicaid Other | Admitting: Obstetrics & Gynecology

## 2015-03-12 VITALS — BP 132/83 | HR 66 | Temp 97.7°F | Wt 162.3 lb

## 2015-03-12 DIAGNOSIS — Z01419 Encounter for gynecological examination (general) (routine) without abnormal findings: Secondary | ICD-10-CM | POA: Diagnosis present

## 2015-03-12 DIAGNOSIS — Z124 Encounter for screening for malignant neoplasm of cervix: Secondary | ICD-10-CM | POA: Diagnosis not present

## 2015-03-12 DIAGNOSIS — Z1151 Encounter for screening for human papillomavirus (HPV): Secondary | ICD-10-CM | POA: Diagnosis not present

## 2015-03-12 DIAGNOSIS — O0972 Supervision of high risk pregnancy due to social problems, second trimester: Secondary | ICD-10-CM

## 2015-03-12 LAB — POCT URINALYSIS DIP (DEVICE)
BILIRUBIN URINE: NEGATIVE
Glucose, UA: NEGATIVE mg/dL
HGB URINE DIPSTICK: NEGATIVE
KETONES UR: NEGATIVE mg/dL
LEUKOCYTES UA: NEGATIVE
NITRITE: NEGATIVE
PH: 6 (ref 5.0–8.0)
Protein, ur: NEGATIVE mg/dL
SPECIFIC GRAVITY, URINE: 1.02 (ref 1.005–1.030)
Urobilinogen, UA: 0.2 mg/dL (ref 0.0–1.0)

## 2015-03-12 NOTE — Patient Instructions (Signed)
Levonorgestrel intrauterine device (IUD) What is this medicine? LEVONORGESTREL IUD (LEE voe nor jes trel) is a contraceptive (birth control) device. The device is placed inside the uterus by a healthcare professional. It is used to prevent pregnancy and can also be used to treat heavy bleeding that occurs during your period. Depending on the device, it can be used for 3 to 5 years. This medicine may be used for other purposes; ask your health care provider or pharmacist if you have questions. What should I tell my health care provider before I take this medicine? They need to know if you have any of these conditions: -abnormal Pap smear -cancer of the breast, uterus, or cervix -diabetes -endometritis -genital or pelvic infection now or in the past -have more than one sexual partner or your partner has more than one partner -heart disease -history of an ectopic or tubal pregnancy -immune system problems -IUD in place -liver disease or tumor -problems with blood clots or take blood-thinners -use intravenous drugs -uterus of unusual shape -vaginal bleeding that has not been explained -an unusual or allergic reaction to levonorgestrel, other hormones, silicone, or polyethylene, medicines, foods, dyes, or preservatives -pregnant or trying to get pregnant -breast-feeding How should I use this medicine? This device is placed inside the uterus by a health care professional. Talk to your pediatrician regarding the use of this medicine in children. Special care may be needed. Overdosage: If you think you have taken too much of this medicine contact a poison control center or emergency room at once. NOTE: This medicine is only for you. Do not share this medicine with others. What if I miss a dose? This does not apply. What may interact with this medicine? Do not take this medicine with any of the following medications: -amprenavir -bosentan -fosamprenavir This medicine may also interact with  the following medications: -aprepitant -barbiturate medicines for inducing sleep or treating seizures -bexarotene -griseofulvin -medicines to treat seizures like carbamazepine, ethotoin, felbamate, oxcarbazepine, phenytoin, topiramate -modafinil -pioglitazone -rifabutin -rifampin -rifapentine -some medicines to treat HIV infection like atazanavir, indinavir, lopinavir, nelfinavir, tipranavir, ritonavir -St. John's wort -warfarin This list may not describe all possible interactions. Give your health care provider a list of all the medicines, herbs, non-prescription drugs, or dietary supplements you use. Also tell them if you smoke, drink alcohol, or use illegal drugs. Some items may interact with your medicine. What should I watch for while using this medicine? Visit your doctor or health care professional for regular check ups. See your doctor if you or your partner has sexual contact with others, becomes HIV positive, or gets a sexual transmitted disease. This product does not protect you against HIV infection (AIDS) or other sexually transmitted diseases. You can check the placement of the IUD yourself by reaching up to the top of your vagina with clean fingers to feel the threads. Do not pull on the threads. It is a good habit to check placement after each menstrual period. Call your doctor right away if you feel more of the IUD than just the threads or if you cannot feel the threads at all. The IUD may come out by itself. You may become pregnant if the device comes out. If you notice that the IUD has come out use a backup birth control method like condoms and call your health care provider. Using tampons will not change the position of the IUD and are okay to use during your period. What side effects may I notice from receiving this medicine?   Side effects that you should report to your doctor or health care professional as soon as possible: -allergic reactions like skin rash, itching or  hives, swelling of the face, lips, or tongue -fever, flu-like symptoms -genital sores -high blood pressure -no menstrual period for 6 weeks during use -pain, swelling, warmth in the leg -pelvic pain or tenderness -severe or sudden headache -signs of pregnancy -stomach cramping -sudden shortness of breath -trouble with balance, talking, or walking -unusual vaginal bleeding, discharge -yellowing of the eyes or skin Side effects that usually do not require medical attention (report to your doctor or health care professional if they continue or are bothersome): -acne -breast pain -change in sex drive or performance -changes in weight -cramping, dizziness, or faintness while the device is being inserted -headache -irregular menstrual bleeding within first 3 to 6 months of use -nausea This list may not describe all possible side effects. Call your doctor for medical advice about side effects. You may report side effects to FDA at 1-800-FDA-1088. Where should I keep my medicine? This does not apply. NOTE: This sheet is a summary. It may not cover all possible information. If you have questions about this medicine, talk to your doctor, pharmacist, or health care provider.    2016, Elsevier/Gold Standard. (2011-06-23 13:54:04)  

## 2015-03-12 NOTE — Progress Notes (Signed)
Subjective:  Rebecca Hancock is a 34 y.o. G1P0 at [redacted]w[redacted]d being seen today for ongoing prenatal care.  Patient reports no complaints.  Contractions: Not present.  Vag. Bleeding: None.  . Denies leaking of fluid.   The following portions of the patient's history were reviewed and updated as appropriate: allergies, current medications, past family history, past medical history, past social history, past surgical history and problem list.   Objective:   Filed Vitals:   03/12/15 1124  BP: 132/83  Pulse: 66  Temp: 97.7 F (36.5 C)  Weight: 162 lb 4.8 oz (73.619 kg)    Fetal Status: Fetal Heart Rate (bpm): 135         General:  Alert, oriented and cooperative. Patient is in no acute distress.  Skin: Skin is warm and dry. No rash noted.   Cardiovascular: Normal heart rate noted  Respiratory: Normal respiratory effort, no problems with respiration noted  Abdomen: Soft, gravid, appropriate for gestational age. Pain/Pressure: Present     Pelvic: Vag. Bleeding: None Vag D/C Character: White   Cervical exam deferred        Extremities: Normal range of motion.  Edema: None  Mental Status: Normal mood and affect. Normal behavior. Normal judgment and thought content.   Urinalysis: Urine Protein: Negative Urine Glucose: Negative  Assessment and Plan:  Pregnancy: G1P0 at [redacted]w[redacted]d  1. Supervision of high risk pregnancy due to social problems, antepartum, second trimester -maintaining methadone through [facility] -refuses flu shot -vag rest for LLP  Preterm labor symptoms and general obstetric precautions including but not limited to vaginal bleeding, contractions, leaking of fluid and fetal movement were reviewed in detail with the patient. Please refer to After Visit Summary for other counseling recommendations.  Return in about 4 weeks (around 04/09/2015).   Guss Bunde, MD

## 2015-03-13 LAB — CYTOLOGY - PAP

## 2015-03-18 ENCOUNTER — Ambulatory Visit (HOSPITAL_COMMUNITY)
Admission: RE | Admit: 2015-03-18 | Discharge: 2015-03-18 | Disposition: A | Discharge status: Home / Self Care | Payer: Medicaid Other | Source: Ambulatory Visit | Attending: Obstetrics & Gynecology | Admitting: Obstetrics & Gynecology

## 2015-03-18 ENCOUNTER — Other Ambulatory Visit (HOSPITAL_COMMUNITY): Payer: Self-pay | Admitting: Obstetrics and Gynecology

## 2015-03-18 ENCOUNTER — Encounter (HOSPITAL_COMMUNITY): Payer: Self-pay

## 2015-03-18 VITALS — BP 128/76 | HR 84 | Wt 164.4 lb

## 2015-03-18 DIAGNOSIS — F1120 Opioid dependence, uncomplicated: Secondary | ICD-10-CM

## 2015-03-18 DIAGNOSIS — O4442 Low lying placenta NOS or without hemorrhage, second trimester: Secondary | ICD-10-CM | POA: Diagnosis present

## 2015-03-18 DIAGNOSIS — O99320 Drug use complicating pregnancy, unspecified trimester: Secondary | ICD-10-CM | POA: Diagnosis not present

## 2015-03-18 DIAGNOSIS — Z3A21 21 weeks gestation of pregnancy: Secondary | ICD-10-CM

## 2015-03-18 DIAGNOSIS — O3412 Maternal care for benign tumor of corpus uteri, second trimester: Secondary | ICD-10-CM

## 2015-03-18 DIAGNOSIS — Z0489 Encounter for examination and observation for other specified reasons: Secondary | ICD-10-CM

## 2015-03-18 DIAGNOSIS — O4440 Low lying placenta NOS or without hemorrhage, unspecified trimester: Secondary | ICD-10-CM

## 2015-03-18 DIAGNOSIS — IMO0002 Reserved for concepts with insufficient information to code with codable children: Secondary | ICD-10-CM

## 2015-03-18 DIAGNOSIS — Z36 Encounter for antenatal screening of mother: Secondary | ICD-10-CM | POA: Insufficient documentation

## 2015-03-18 DIAGNOSIS — D259 Leiomyoma of uterus, unspecified: Secondary | ICD-10-CM

## 2015-03-19 ENCOUNTER — Encounter: Payer: Self-pay | Admitting: Family Medicine

## 2015-04-09 ENCOUNTER — Encounter: Payer: Medicaid Other | Admitting: Obstetrics & Gynecology

## 2015-04-16 ENCOUNTER — Ambulatory Visit (HOSPITAL_COMMUNITY)
Admission: RE | Admit: 2015-04-16 | Discharge: 2015-04-16 | Disposition: A | Discharge status: Home / Self Care | Payer: Medicaid Other | Source: Ambulatory Visit | Attending: Obstetrics & Gynecology | Admitting: Obstetrics & Gynecology

## 2015-04-16 ENCOUNTER — Other Ambulatory Visit (HOSPITAL_COMMUNITY): Payer: Self-pay | Admitting: Obstetrics and Gynecology

## 2015-04-16 ENCOUNTER — Ambulatory Visit (INDEPENDENT_AMBULATORY_CARE_PROVIDER_SITE_OTHER): Payer: Medicaid Other | Admitting: Obstetrics & Gynecology

## 2015-04-16 ENCOUNTER — Encounter (HOSPITAL_COMMUNITY): Payer: Self-pay

## 2015-04-16 VITALS — BP 137/88 | HR 71 | Wt 168.2 lb

## 2015-04-16 VITALS — BP 127/75 | HR 63 | Temp 97.5°F | Wt 169.2 lb

## 2015-04-16 DIAGNOSIS — O0972 Supervision of high risk pregnancy due to social problems, second trimester: Secondary | ICD-10-CM

## 2015-04-16 DIAGNOSIS — T402X5A Adverse effect of other opioids, initial encounter: Principal | ICD-10-CM

## 2015-04-16 DIAGNOSIS — O99320 Drug use complicating pregnancy, unspecified trimester: Secondary | ICD-10-CM | POA: Diagnosis present

## 2015-04-16 DIAGNOSIS — F1120 Opioid dependence, uncomplicated: Secondary | ICD-10-CM

## 2015-04-16 DIAGNOSIS — O3412 Maternal care for benign tumor of corpus uteri, second trimester: Secondary | ICD-10-CM

## 2015-04-16 DIAGNOSIS — O99322 Drug use complicating pregnancy, second trimester: Secondary | ICD-10-CM

## 2015-04-16 DIAGNOSIS — D259 Leiomyoma of uterus, unspecified: Secondary | ICD-10-CM | POA: Diagnosis not present

## 2015-04-16 DIAGNOSIS — K5903 Drug induced constipation: Secondary | ICD-10-CM

## 2015-04-16 DIAGNOSIS — Z3A25 25 weeks gestation of pregnancy: Secondary | ICD-10-CM | POA: Insufficient documentation

## 2015-04-16 DIAGNOSIS — F1110 Opioid abuse, uncomplicated: Secondary | ICD-10-CM | POA: Diagnosis not present

## 2015-04-16 LAB — POCT URINALYSIS DIP (DEVICE)
BILIRUBIN URINE: NEGATIVE
GLUCOSE, UA: NEGATIVE mg/dL
Hgb urine dipstick: NEGATIVE
Ketones, ur: NEGATIVE mg/dL
LEUKOCYTES UA: NEGATIVE
NITRITE: NEGATIVE
Protein, ur: NEGATIVE mg/dL
Specific Gravity, Urine: 1.02 (ref 1.005–1.030)
Urobilinogen, UA: 0.2 mg/dL (ref 0.0–1.0)
pH: 6 (ref 5.0–8.0)

## 2015-04-16 MED ORDER — LACTULOSE 20 GM/30ML PO SOLN: 30.0000 mL | Freq: Two times a day (BID) | ORAL | Status: DC | PRN

## 2015-04-16 MED ORDER — LUBIPROSTONE 24 MCG PO CAPS: 24.0000 ug | ORAL_CAPSULE | Freq: Two times a day (BID) | ORAL | Status: DC

## 2015-04-16 NOTE — Progress Notes (Signed)
C/o cramps. Also c/o constipation.

## 2015-04-16 NOTE — Progress Notes (Signed)
Subjective:  Rebecca Hancock is a 34 y.o. G1P0 at [redacted]w[redacted]d being seen today for ongoing prenatal care.  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  Desires prescription strength medications.  Contractions: Not present.  Vag. Bleeding: None. Movement: Present. Denies leaking of fluid.   The following portions of the patient's history were reviewed and updated as appropriate: allergies, current medications, past family history, past medical history, past social history, past surgical history and problem list. Problem list updated.  Objective:   Filed Vitals:   04/16/15 1129  BP: 127/75  Pulse: 63  Temp: 97.5 F (36.4 C)  Weight: 169 lb 3.2 oz (76.749 kg)    Fetal Status: Fetal Heart Rate (bpm): 140   Movement: Present     General:  Alert, oriented and cooperative. Patient is in no acute distress.  Skin: Skin is warm and dry. No rash noted.   Cardiovascular: Normal heart rate noted  Respiratory: Normal respiratory effort, no problems with respiration noted  Abdomen: Soft, gravid, appropriate for gestational age. Pain/Pressure: Absent     Pelvic: Vag. Bleeding: None    Cervical exam deferred        Extremities: Normal range of motion.  Edema: None  Mental Status: Normal mood and affect. Normal behavior. Normal judgment and thought content.   Urinalysis: Urine Protein: Negative Urine Glucose: Negative  Assessment and Plan:  Pregnancy: G1P0 at [redacted]w[redacted]d  1. Constipation due to opioid therapy Will try lactulose. If this does not work, she can try Amitiza - Lactulose 20 GM/30ML SOLN; Take 30 mLs (20 g total) by mouth 2 (two) times daily as needed (constipation).  Dispense: 946 mL; Refill: 2 - lubiprostone (AMITIZA) 24 MCG capsule; Take 1 capsule (24 mcg total) by mouth 2 (two) times daily with a meal.  Dispense: 60 capsule; Refill: 3  2. Methadone maintenance treatment affecting pregnancy, antepartum ([place]) Continue  Methadone via [facility]  3. Supervision of high risk pregnancy due to social problems, antepartum, second trimester Preterm labor symptoms and general obstetric precautions including but not limited to vaginal bleeding, contractions, leaking of fluid and fetal movement were reviewed in detail with the patient. Please refer to After Visit Summary for other counseling recommendations.  Return in about 3 weeks (around 05/07/2015) for 1 hr GTT, OB Visit, 3rd trimester labs, TDap.   Osborne Oman, MD

## 2015-04-16 NOTE — Patient Instructions (Signed)
Return to clinic for any obstetric concerns or go to [facility] for evaluation  

## 2015-05-07 ENCOUNTER — Ambulatory Visit (INDEPENDENT_AMBULATORY_CARE_PROVIDER_SITE_OTHER): Payer: Medicaid Other | Admitting: Obstetrics & Gynecology

## 2015-05-07 VITALS — BP 134/84 | HR 71 | Temp 97.9°F

## 2015-05-07 DIAGNOSIS — F1120 Opioid dependence, uncomplicated: Secondary | ICD-10-CM | POA: Diagnosis not present

## 2015-05-07 DIAGNOSIS — O0973 Supervision of high risk pregnancy due to social problems, third trimester: Secondary | ICD-10-CM | POA: Diagnosis not present

## 2015-05-07 DIAGNOSIS — O24419 Gestational diabetes mellitus in pregnancy, unspecified control: Secondary | ICD-10-CM

## 2015-05-07 DIAGNOSIS — O99320 Drug use complicating pregnancy, unspecified trimester: Secondary | ICD-10-CM

## 2015-05-07 DIAGNOSIS — O99323 Drug use complicating pregnancy, third trimester: Secondary | ICD-10-CM

## 2015-05-07 LAB — POCT URINALYSIS DIP (DEVICE)
BILIRUBIN URINE: NEGATIVE
Glucose, UA: NEGATIVE mg/dL
HGB URINE DIPSTICK: NEGATIVE
KETONES UR: NEGATIVE mg/dL
Leukocytes, UA: NEGATIVE
Nitrite: NEGATIVE
PH: 6.5 (ref 5.0–8.0)
PROTEIN: NEGATIVE mg/dL
Specific Gravity, Urine: 1.01 (ref 1.005–1.030)
Urobilinogen, UA: 0.2 mg/dL (ref 0.0–1.0)

## 2015-05-07 LAB — CBC
HEMATOCRIT: 36.4 % (ref 36.0–46.0)
HEMOGLOBIN: 12.2 g/dL (ref 12.0–15.0)
MCH: 29.1 pg (ref 26.0–34.0)
MCHC: 33.5 g/dL (ref 30.0–36.0)
MCV: 86.9 fL (ref 78.0–100.0)
MPV: 9.8 fL (ref 8.6–12.4)
Platelets: 315 10*3/uL (ref 150–400)
RBC: 4.19 MIL/uL (ref 3.87–5.11)
RDW: 12.6 % (ref 11.5–15.5)
WBC: 10.8 10*3/uL — ABNORMAL HIGH (ref 4.0–10.5)

## 2015-05-07 LAB — RPR

## 2015-05-07 NOTE — Progress Notes (Signed)
Subjective:low back and buttocks pain  Rebecca Hancock is a 34 y.o. G1P0 at [redacted]w[redacted]d being seen today for ongoing prenatal care.  She is currently monitored for the following issues for this high-risk pregnancy and has Polysubstance abuse; Elevated blood pressure; Depression; Anxiety; Insomnia; Supervision of high risk pregnancy due to social problems, antepartum; and Methadone maintenance treatment affecting pregnancy, antepartum ([place]) on her problem list.  Patient reports backache.  Contractions: Not present. Vag. Bleeding: None.  Movement: Present. Denies leaking of fluid.   The following portions of the patient's history were reviewed and updated as appropriate: allergies, current medications, past family history, past medical history, past social history, past surgical history and problem list. Problem list updated.  Objective:   Filed Vitals:   05/07/15 1102  BP: 134/84  Pulse: 71  Temp: 97.9 F (36.6 C)    Fetal Status: Fetal Heart Rate (bpm): 140 Fundal Height: 28 cm Movement: Present     General:  Alert, oriented and cooperative. Patient is in no acute distress.  Skin: Skin is warm and dry. No rash noted.   Cardiovascular: Normal heart rate noted  Respiratory: Normal respiratory effort, no problems with respiration noted  Abdomen: Soft, gravid, appropriate for gestational age. Pain/Pressure: Present     Pelvic: Vag. Bleeding: None     Cervical exam deferred        Extremities: Normal range of motion.     Mental Status: Normal mood and affect. Normal behavior. Normal judgment and thought content.   Urinalysis:      Assessment and Plan:  Pregnancy: G1P0 at [redacted]w[redacted]d  1. Gestational diabetes mellitus in second trimester, unspecified diabetic control screening - Glucose Tolerance, 1 HR (50g)  2. Supervision of high risk pregnancy due to social problems, antepartum, third trimester methadone  3. Methadone maintenance treatment affecting pregnancy, antepartum ([place]) Dose  reviewed  Preterm labor symptoms and general obstetric precautions including but not limited to vaginal bleeding, contractions, leaking of fluid and fetal movement were reviewed in detail with the patient. Please refer to After Visit Summary for other counseling recommendations.  Return in about 2 weeks (around 05/21/2015).   Woodroe Mode, MD

## 2015-05-07 NOTE — Progress Notes (Signed)
Declined TDAP. 

## 2015-05-07 NOTE — Patient Instructions (Signed)
Piriformis Syndrome With Rehab Piriformis syndrome is a condition the affects the nervous system in the area of the hip, and is characterized by pain and possibly a loss of feeling in the backside (posterior) thigh that may extend down the entire length of the leg. The symptoms are caused by an increase in pressure on the sciatic nerve by the piriformis muscle, which is on the back of the hip and is responsible for externally rotating the hip. The sciatic nerve and its branches connect to much of the leg. Normally the sciatic nerve runs between the piriformis muscle and other muscles. However, in certain individuals the nerve runs through the muscle, which causes an increase in pressure on the nerve and results in the symptoms of piriformis syndrome. SYMPTOMS   Pain, tingling, numbness, or burning in the back of the thigh that may also extend down the entire leg.  Occasionally, tenderness in the buttock.  Loss of function of the leg.  Pain that worsens when using the piriformis muscle (running, jumping, or stairs).  Pain that increases with prolonged sitting.  Pain that is lessened by lying flat on the back. CAUSES   Piriformis syndrome is the result of an increase in pressure placed on the sciatic nerve. Oftentimes, piriformis syndrome is an overuse injury.  Stress placed on the nerve from a sudden increase in the intensity, frequency, or duration of training.  Compensation of other extremity injuries. RISK INCREASES WITH:  Sports that involve the piriformis muscle (running, walking, or jumping).  You are born with (congenital) a defect in which the sciatic nerve passes through the muscle. PREVENTION  Warm up and stretch properly before activity.  Allow for adequate recovery between workouts.  Maintain physical fitness:  Strength, flexibility, and endurance.  Cardiovascular fitness. PROGNOSIS  If treated properly, the symptoms of piriformis syndrome usually resolve in 2 to 6  weeks. RELATED COMPLICATIONS   Persistent and possibly permanent pain and numbness in the lower extremity.  Weakness of the extremity that may progress to disability and inability to compete. TREATMENT  The most effective treatment for piriformis syndrome is rest from any activities that aggravate the symptoms. Ice and pain medication may help reduce pain and inflammation. The use of strengthening and stretching exercises may help reduce pain with activity. These exercises may be performed at home or with a therapist. A referral to a therapist may be given for further evaluation and treatment, such as ultrasound. Corticosteroid injections may be given to reduce inflammation that is causing pressure to be placed on the sciatic nerve. If nonsurgical (conservative) treatment is unsuccessful, then surgery may be recommended.  MEDICATION   If pain medication is necessary, then nonsteroidal anti-inflammatory medications, such as aspirin and ibuprofen, or other minor pain relievers, such as acetaminophen, are often recommended.  Do not take pain medication for 7 days before surgery.  Prescription pain relievers may be given if deemed necessary by your caregiver. Use only as directed and only as much as you need.  Corticosteroid injections may be given by your caregiver. These injections should be reserved for the most serious cases, because they may only be given a certain number of times. HEAT AND COLD:   Cold treatment (icing) relieves pain and reduces inflammation. Cold treatment should be applied for 10 to 15 minutes every 2 to 3 hours for inflammation and pain and immediately after any activity that aggravates your symptoms. Use ice packs or massage the area with a piece of ice (ice massage).  Heat   treatment may be used prior to performing the stretching and strengthening activities prescribed by your caregiver, physical therapist, or athletic trainer. Use a heat pack or soak the injury in warm  water. SEEK IMMEDIATE MEDICAL CARE IF:  Treatment seems to offer no benefit, or the condition worsens.  Any medications produce adverse side effects. EXERCISES RANGE OF MOTION (ROM) AND STRETCHING EXERCISES - Piriformis Syndrome These exercises may help you when beginning to rehabilitate your injury. Your symptoms may resolve with or without further involvement from your physician, physical therapist, or athletic trainer. While completing these exercises, remember:   Restoring tissue flexibility helps normal motion to return to the joints. This allows healthier, less painful movement and activity.  An effective stretch should be held for at least 30 seconds.  A stretch should never be painful. You should only feel a gentle lengthening or release in the stretched tissue. STRETCH - Hip Rotators  Lie on your back on a firm surface. Grasp your right / left knee with your right / left hand and your ankle with your opposite hand.  Keeping your hips and shoulders firmly planted, gently pull your right / left knee and rotate your lower leg toward your opposite shoulder until you feel a stretch in your buttocks.  Hold this stretch for __________ seconds. Repeat this stretch __________ times. Complete this stretch __________ times per day. STRETCH - Iliotibial Band  On the floor or bed, lie on your side so your right / left leg is on top. Bend your knee and grab your ankle.  Slowly bring your knee back so that your thigh is in line with your trunk. Keep your heel at your buttocks and gently arch your back so your head, shoulders, and hips line up.  Slowly lower your leg so that your knee approaches the floor/bed until you feel a gentle stretch on the outside of your right / left thigh. If you do not feel a stretch and your knee will not fall farther, place the heel of your opposite foot on top of your knee and pull your thigh down farther.  Hold this stretch for __________ seconds. Repeat  __________ times. Complete __________ times per day. STRENGTHENING EXERCISES - Piriformis Syndrome  These are some of the caregiver again or until your symptoms are resolved. Remember:   Strong muscles with good endurance tolerate stress better.  Do the exercises as initially prescribed by your caregiver. Progress slowly with each exercise, gradually increasing the number of repetitions and weight used under their guidance. STRENGTH - Hip Abductors, Straight Leg Raises Be aware of your form throughout the entire exercise so that you exercise the correct muscles. Sloppy form means that you are not strengthening the correct muscles.  Lie on your side so that your head, shoulders, knee, and hip line up. You may bend your lower knee to help maintain your balance. Your right / left leg should be on top.  Roll your hips slightly forward, so that your hips are stacked directly over each other and your right / left knee is facing forward.  Lift your top leg up 4-6 inches, leading with your heel. Be sure that your foot does not drift forward or that your knee does not roll toward the ceiling.  Hold this position for __________ seconds. You should feel the muscles in your outer hip lifting (you may not notice this until your leg begins to tire).  Slowly lower your leg to the starting position. Allow the muscles to fully  relax before beginning the next repetition. Repeat __________ times. Complete this exercise __________ times per day.  STRENGTH - Hip Abductors, Quadruped  On a firm, lightly padded surface, position yourself on your hands and knees. Your hands should be directly below your shoulders and your knees should be directly below your hips.  Keeping your right / left knee bent, lift your leg out to the side. Keep your legs level and in line with your shoulders.  Position yourself on your hands and knees.  Hold for __________ seconds.  Keeping your trunk steady and your hips level, slowly  lower your leg to the starting position. Repeat __________ times. Complete this exercise __________ times per day.  STRENGTH - Hip Abductors, Standing  Tie one end of a rubber exercise band/tubing to a secure surface (table, pole) and tie a loop at the other end.  Place the loop around your right / left ankle. Keeping your ankle with the band directly opposite of the secured end, step away until there is tension in the tube/band.  Hold onto a chair as needed for balance.  Keeping your back upright, your shoulders over your hips, and your toes pointing forward, lift your right / left leg out to your side. Be sure to lift your leg with your hip muscles. Do not "throw" your leg or tip your body to lift your leg.  Slowly and with control, return to the starting position. Repeat exercise __________ times. Complete this exercise __________ times per day.    This information is not intended to replace advice given to you by your health care provider. Make sure you discuss any questions you have with your health care provider.   Document Released: 05/23/2005 Document Revised: 10/07/2014 Document Reviewed: 09/04/2008 Elsevier Interactive Patient Education 2016 [employer] Radicular Pain Radicular pain in either the arm or leg is usually from a bulging or herniated disk in the spine. A piece of the herniated disk may press against the nerves as the nerves exit the spine. This causes pain which is felt at the tips of the nerves down the arm or leg. Other causes of radicular pain may include:  Fractures.  Heart disease.  Cancer.  An abnormal and usually degenerative state of the nervous system or nerves (neuropathy). Diagnosis may require CT or MRI scanning to determine the primary cause.  Nerves that start at the neck (nerve roots) may cause radicular pain in the outer shoulder and arm. It can spread down to the thumb and fingers. The symptoms vary depending on which nerve root has been  affected. In most cases radicular pain improves with conservative treatment. Neck problems may require physical therapy, a neck collar, or cervical traction. Treatment may take many weeks, and surgery may be considered if the symptoms do not improve.  Conservative treatment is also recommended for sciatica. Sciatica causes pain to radiate from the lower back or buttock area down the leg into the foot. Often there is a history of back problems. Most patients with sciatica are better after 2 to 4 weeks of rest and other supportive care. Short term bed rest can reduce the disk pressure considerably. Sitting, however, is not a good position since this increases the pressure on the disk. You should avoid bending, lifting, and all other activities which make the problem worse. Traction can be used in severe cases. Surgery is usually reserved for patients who do not improve within the first months of treatment. Only take over-the-counter or prescription medicines for pain,  discomfort, or fever as directed by your caregiver. Narcotics and muscle relaxants may help by relieving more severe pain and spasm and by providing mild sedation. Cold or massage can give significant relief. Spinal manipulation is not recommended. It can increase the degree of disc protrusion. Epidural steroid injections are often effective treatment for radicular pain. These injections deliver medicine to the spinal nerve in the space between the protective covering of the spinal cord and back bones (vertebrae). Your caregiver can give you more information about steroid injections. These injections are most effective when given within two weeks of the onset of pain.  You should see your caregiver for follow up care as recommended. A program for neck and back injury rehabilitation with stretching and strengthening exercises is an important part of management.  SEEK IMMEDIATE MEDICAL CARE IF:  You develop increased pain, weakness, or numbness in  your arm or leg.  You develop difficulty with bladder or bowel control.  You develop abdominal pain.   This information is not intended to replace advice given to you by your health care provider. Make sure you discuss any questions you have with your health care provider.   Document Released: 06/30/2004 Document Revised: 06/13/2014 Document Reviewed: 12/17/2014 Elsevier Interactive Patient Education [employer].

## 2015-05-08 LAB — HIV ANTIBODY (ROUTINE TESTING W REFLEX): HIV: NONREACTIVE

## 2015-05-08 LAB — GLUCOSE TOLERANCE, 1 HOUR (50G) W/O FASTING: GLUCOSE 1 HOUR GTT: 91 mg/dL (ref 70–140)

## 2015-05-21 ENCOUNTER — Ambulatory Visit (INDEPENDENT_AMBULATORY_CARE_PROVIDER_SITE_OTHER): Payer: Medicaid Other | Admitting: Certified Nurse Midwife

## 2015-05-21 VITALS — BP 123/87 | HR 82 | Temp 97.5°F | Wt 177.3 lb

## 2015-05-21 DIAGNOSIS — F1910 Other psychoactive substance abuse, uncomplicated: Secondary | ICD-10-CM

## 2015-05-21 DIAGNOSIS — O0973 Supervision of high risk pregnancy due to social problems, third trimester: Secondary | ICD-10-CM

## 2015-05-21 DIAGNOSIS — O99320 Drug use complicating pregnancy, unspecified trimester: Secondary | ICD-10-CM

## 2015-05-21 DIAGNOSIS — F1120 Opioid dependence, uncomplicated: Secondary | ICD-10-CM

## 2015-05-21 DIAGNOSIS — O99323 Drug use complicating pregnancy, third trimester: Secondary | ICD-10-CM

## 2015-05-21 LAB — POCT URINALYSIS DIP (DEVICE)
Bilirubin Urine: NEGATIVE
GLUCOSE, UA: NEGATIVE mg/dL
Hgb urine dipstick: NEGATIVE
Ketones, ur: NEGATIVE mg/dL
LEUKOCYTES UA: NEGATIVE
NITRITE: NEGATIVE
PROTEIN: NEGATIVE mg/dL
Specific Gravity, Urine: 1.025 (ref 1.005–1.030)
UROBILINOGEN UA: 0.2 mg/dL (ref 0.0–1.0)
pH: 5.5 (ref 5.0–8.0)

## 2015-05-21 MED ORDER — VALACYCLOVIR HCL 1 G PO TABS: 2000.0000 mg | ORAL_TABLET | Freq: Two times a day (BID) | ORAL | Status: DC

## 2015-05-21 NOTE — Progress Notes (Signed)
Subjective:  Rebecca Hancock is a 34 y.o. G1P0 at [redacted]w[redacted]d being seen today for ongoing prenatal care.  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  Patient reports coldsores on mouth.  Contractions: Irritability. Vag. Bleeding: None.  Movement: Present. Denies leaking of fluid.   The following portions of the patient's history were reviewed and updated as appropriate: allergies, current medications, past family history, past medical history, past social history, past surgical history and problem list. Problem list updated.  Objective:   Filed Vitals:   05/21/15 1102 05/21/15 1105  BP: 141/87 123/87  Pulse: 105 82  Temp: 97.5 F (36.4 C)   Weight: 177 lb 4.8 oz (80.423 kg)     Fetal Status: Fetal Heart Rate (bpm): 133   Movement: Present     General:  Alert, oriented and cooperative. Patient is in no acute distress.  Skin: Skin is warm and dry. No rash noted.   Cardiovascular: Normal heart rate noted  Respiratory: Normal respiratory effort, no problems with respiration noted  Abdomen: Soft, gravid, appropriate for gestational age. Pain/Pressure: Present     Pelvic: Vag. Bleeding: None     Cervical exam deferred        Extremities: Normal range of motion.  Edema: None  Mental Status: Normal mood and affect. Normal behavior. Normal judgment and thought content.   Urinalysis:      Assessment and Plan:  Pregnancy: G1P0 at [redacted]w[redacted]d  1. Polysubstance abuse   2. Supervision of high risk pregnancy due to social problems, antepartum, third trimester   3. 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 Cold sores      Valtrex 2000mg  2x 1 day  Preterm labor symptoms and general  obstetric precautions including but not limited to vaginal bleeding, contractions, leaking of fluid and fetal movement were reviewed in detail with the patient. Please refer to After Visit Summary for other counseling recommendations.  Return in about 2 weeks (around 06/04/2015).   Larey Days, CNM

## 2015-05-21 NOTE — Patient Instructions (Addendum)
Third Trimester of Pregnancy The third trimester is from week 29 through week 42, months 7 through 9. The third trimester is a time when the fetus is growing rapidly. At the end of the ninth month, the fetus is about 20 inches in length and weighs 6-10 pounds.  BODY CHANGES Your body goes through many changes during pregnancy. The changes vary from woman to woman.   Your weight will continue to increase. You can expect to gain 25-35 pounds (11-16 kg) by the end of the pregnancy.  You may begin to get stretch marks on your hips, abdomen, and breasts.  You may urinate more often because the fetus is moving lower into your pelvis and pressing on your bladder.  You may develop or continue to have heartburn as a result of your pregnancy.  You may develop constipation because certain hormones are causing the muscles that push waste through your intestines to slow down.  You may develop hemorrhoids or swollen, bulging veins (varicose veins).  You may have pelvic pain because of the weight gain and pregnancy hormones relaxing your joints between the bones in your pelvis. Backaches may result from overexertion of the muscles supporting your posture.  You may have changes in your hair. These can include thickening of your hair, rapid growth, and changes in texture. Some women also have hair loss during or after pregnancy, or hair that feels dry or thin. Your hair will most likely return to normal after your baby is born.  Your breasts will continue to grow and be tender. A yellow discharge may leak from your breasts called colostrum.  Your belly button may stick out.  You may feel short of breath because of your expanding uterus.  You may notice the fetus "dropping," or moving lower in your abdomen.  You may have a bloody mucus discharge. This usually occurs a few days to a week before labor begins.  Your cervix becomes thin and soft (effaced) near your due date. WHAT TO EXPECT AT YOUR PRENATAL  EXAMS  You will have prenatal exams every 2 weeks until week 36. Then, you will have weekly prenatal exams. During a routine prenatal visit:  You will be weighed to make sure you and the fetus are growing normally.  Your blood pressure is taken.  Your abdomen will be measured to track your baby's growth.  The fetal heartbeat will be listened to.  Any test results from the previous visit will be discussed.  You may have a cervical check near your due date to see if you have effaced. At around 36 weeks, your caregiver will check your cervix. At the same time, your caregiver will also perform a test on the secretions of the vaginal tissue. This test is to determine if a type of bacteria, Group B streptococcus, is present. Your caregiver will explain this further. Your caregiver may ask you:  What your birth plan is.  How you are feeling.  If you are feeling the baby move.  If you have had any abnormal symptoms, such as leaking fluid, bleeding, severe headaches, or abdominal cramping.  If you are using any tobacco products, including cigarettes, chewing tobacco, and electronic cigarettes.  If you have any questions. Other tests or screenings that may be performed during your third trimester include:  Blood tests that check for low iron levels (anemia).  Fetal testing to check the health, activity level, and growth of the fetus. Testing is done if you have certain medical conditions or if  there are problems during the pregnancy.  HIV (human immunodeficiency virus) testing. If you are at high risk, you may be screened for HIV during your third trimester of pregnancy. FALSE LABOR You may feel small, irregular contractions that eventually go away. These are called Braxton Hicks contractions, or false labor. Contractions may last for hours, days, or even weeks before true labor sets in. If contractions come at regular intervals, intensify, or become painful, it is best to be seen by your  caregiver.  SIGNS OF LABOR   Menstrual-like cramps.  Contractions that are 5 minutes apart or less.  Contractions that start on the top of the uterus and spread down to the lower abdomen and back.  A sense of increased pelvic pressure or back pain.  A watery or bloody mucus discharge that comes from the vagina. If you have any of these signs before the 37th week of pregnancy, call your caregiver right away. You need to go to the hospital to get checked immediately. HOME CARE INSTRUCTIONS   Avoid all smoking, herbs, alcohol, and unprescribed drugs. These chemicals affect the formation and growth of the baby.  Do not use any tobacco products, including cigarettes, chewing tobacco, and electronic cigarettes. If you need help quitting, ask your health care provider. You may receive counseling support and other resources to help you quit.  Follow your caregiver's instructions regarding medicine use. There are medicines that are either safe or unsafe to take during pregnancy.  Exercise only as directed by your caregiver. Experiencing uterine cramps is a good sign to stop exercising.  Continue to eat regular, healthy meals.  Wear a good support bra for breast tenderness.  Do not use hot tubs, steam rooms, or saunas.  Wear your seat belt at all times when driving.  Avoid raw meat, uncooked cheese, cat litter boxes, and soil used by cats. These carry germs that can cause birth defects in the baby.  Take your prenatal vitamins.  Take 1500-2000 mg of calcium daily starting at the 20th week of pregnancy until you deliver your baby.  Try taking a stool softener (if your caregiver approves) if you develop constipation. Eat more high-fiber foods, such as fresh vegetables or fruit and whole grains. Drink plenty of fluids to keep your urine clear or pale yellow.  Take warm sitz baths to soothe any pain or discomfort caused by hemorrhoids. Use hemorrhoid cream if your caregiver approves.  If  you develop varicose veins, wear support hose. Elevate your feet for 15 minutes, 3-4 times a day. Limit salt in your diet.  Avoid heavy lifting, wear low heal shoes, and practice good posture.  Rest a lot with your legs elevated if you have leg cramps or low back pain.  Visit your dentist if you have not gone during your pregnancy. Use a soft toothbrush to brush your teeth and be gentle when you floss.  A sexual relationship may be continued unless your caregiver directs you otherwise.  Do not travel far distances unless it is absolutely necessary and only with the approval of your caregiver.  Take prenatal classes to understand, practice, and ask questions about the labor and delivery.  Make a trial run to the hospital.  Pack your hospital bag.  Prepare the baby's nursery.  Continue to go to all your prenatal visits as directed by your caregiver. SEEK MEDICAL CARE IF:  You are unsure if you are in labor or if your water has broken.  You have dizziness.  You have  mild pelvic cramps, pelvic pressure, or nagging pain in your abdominal area.  You have persistent nausea, vomiting, or diarrhea.  You have a bad smelling vaginal discharge.  You have pain with urination. SEEK IMMEDIATE MEDICAL CARE IF:   You have a fever.  You are leaking fluid from your vagina.  You have spotting or bleeding from your vagina.  You have severe abdominal cramping or pain.  You have rapid weight loss or gain.  You have shortness of breath with chest pain.  You notice sudden or extreme swelling of your face, hands, ankles, feet, or legs.  You have not felt your baby move in over an hour.  You have severe headaches that do not go away with medicine.  You have vision changes.   This information is not intended to replace advice given to you by your health care provider. Make sure you discuss any questions you have with your health care provider.   Document Released: 05/17/2001 Document  Revised: 06/13/2014 Document Reviewed: 07/24/2012 Elsevier Interactive Patient Education 2016 Else[employer] Cold Sore A cold sore (fever blister) is a skin infection caused by the herpes simplex virus (HSV-1). HSV-1 is closely related to the virus that causes genital herpes (HSV-2), but they are not the same even though both viruses can cause oral and genital infections. Cold sores are small, fluid-filled sores inside of the mouth or on the lips, gums, nose, chin, cheeks, or fingers.  The herpes simplex virus can be easily passed (contagious) to other people through close personal contact, such as kissing or sharing personal items. The virus can also spread to other parts of the body, such as the eyes or genitals. Cold sores are contagious until the sores crust over completely. They often heal within 2 weeks.  Once a person is infected, the herpes simplex virus remains permanently in the body. Therefore, there is no cure for cold sores, and they often recur when a person is tired, stressed, sick, or gets too much sun. Additional factors that can cause a recurrence include hormone changes in menstruation or pregnancy, certain drugs, and cold weather.  CAUSES  Cold sores are caused by the herpes simplex virus. The virus is spread from person to person through close contact, such as through kissing, touching the affected area, or sharing personal items such as lip balm, razors, or eating utensils.  SYMPTOMS  The first infection may not cause symptoms. If symptoms develop, the symptoms often go through different stages. Here is how a cold sore develops:   Tingling, itching, or burning is felt 1-2 days before the outbreak.   Fluid-filled blisters appear on the lips, inside the mouth, nose, or on the cheeks.   The blisters start to ooze clear fluid.   The blisters dry up and a yellow crust appears in its place.   The crust falls off.  Symptoms depend on whether it is the initial outbreak or a  recurrence. Some other symptoms with the first outbreak may include:   Fever.   Sore throat.   Headache.   Muscle aches.   Swollen neck glands.  DIAGNOSIS  A diagnosis is often made based on your symptoms and looking at the sores. Sometimes, a sore may be swabbed and then examined in the lab to make a final diagnosis. If the sores are not present, blood tests can find the herpes simplex virus.  TREATMENT  There is no cure for cold sores and no vaccine for the herpes simplex virus. Within 2  weeks, most cold sores go away on their own without treatment. Medicines cannot make the infection go away, but medicine can help relieve some of the pain associated with the sores, can work to stop the virus from multiplying, and can also shorten healing time. Medicine may be in the form of creams, gels, pills, or a shot.  HOME CARE INSTRUCTIONS   Only take over-the-counter or prescription medicines for pain, discomfort, or fever as directed by your caregiver. Do not use aspirin.   Use a cotton-tip swab to apply creams or gels to your sores.   Do not touch the sores or pick the scabs. Wash your hands often. Do not touch your eyes without washing your hands first.   Avoid kissing, oral sex, and sharing personal items until sores heal.   Apply an ice pack on your sores for 10-15 minutes to ease any discomfort.   Avoid hot, cold, or salty foods because they may hurt your mouth. Eat a soft, bland diet to avoid irritating the sores. Use a straw to drink if you have pain when drinking out of a glass.   Keep sores clean and dry to prevent an infection of other tissues.   Avoid the sun and limit stress if these things trigger outbreaks. If sun causes cold sores, apply sunscreen on the lips before being out in the sun.  SEEK MEDICAL CARE IF:   You have a fever or persistent symptoms for more than 2-3 days.   You have a fever and your symptoms suddenly get worse.   You have pus, not  clear fluid, coming from the sores.   You have redness that is spreading.   You have pain or irritation in your eye.   You get sores on your genitals.   Your sores do not heal within 2 weeks.   You have a weakened immune system.   You have frequent recurrences of cold sores.  MAKE SURE YOU:   Understand these instructions.  Will watch your condition.  Will get help right away if you are not doing well or get worse.   This information is not intended to replace advice given to you by your health care provider. Make sure you discuss any questions you have with your health care provider.   Document Released: 05/20/2000 Document Revised: 06/13/2014 Document Reviewed: 10/05/2011 Elsevier Interactive Patient Education [employer].

## 2015-05-21 NOTE — Progress Notes (Signed)
Pt reports "swollen, bumpy lips"

## 2015-05-26 ENCOUNTER — Telehealth: Payer: Self-pay

## 2015-05-26 ENCOUNTER — Ambulatory Visit (INDEPENDENT_AMBULATORY_CARE_PROVIDER_SITE_OTHER): Payer: Medicaid Other | Admitting: Advanced Practice Midwife

## 2015-05-26 ENCOUNTER — Other Ambulatory Visit: Payer: Self-pay | Admitting: Advanced Practice Midwife

## 2015-05-26 ENCOUNTER — Encounter: Payer: Self-pay | Admitting: Advanced Practice Midwife

## 2015-05-26 VITALS — BP 123/70 | HR 74 | Temp 97.6°F | Wt 180.9 lb

## 2015-05-26 DIAGNOSIS — O99323 Drug use complicating pregnancy, third trimester: Secondary | ICD-10-CM

## 2015-05-26 DIAGNOSIS — T402X5A Adverse effect of other opioids, initial encounter: Secondary | ICD-10-CM | POA: Diagnosis not present

## 2015-05-26 DIAGNOSIS — T783XXA Angioneurotic edema, initial encounter: Secondary | ICD-10-CM | POA: Insufficient documentation

## 2015-05-26 DIAGNOSIS — K5903 Drug induced constipation: Secondary | ICD-10-CM

## 2015-05-26 DIAGNOSIS — F1120 Opioid dependence, uncomplicated: Secondary | ICD-10-CM | POA: Diagnosis not present

## 2015-05-26 DIAGNOSIS — T783XXD Angioneurotic edema, subsequent encounter: Secondary | ICD-10-CM

## 2015-05-26 LAB — POCT URINALYSIS DIP (DEVICE)
Bilirubin Urine: NEGATIVE
GLUCOSE, UA: NEGATIVE mg/dL
Hgb urine dipstick: NEGATIVE
Ketones, ur: NEGATIVE mg/dL
LEUKOCYTES UA: NEGATIVE
NITRITE: NEGATIVE
PH: 5.5 (ref 5.0–8.0)
PROTEIN: NEGATIVE mg/dL
Specific Gravity, Urine: >=1.03 (ref 1.005–1.030)
UROBILINOGEN UA: 0.2 mg/dL (ref 0.0–1.0)

## 2015-05-26 MED ORDER — METHYLPREDNISOLONE 4 MG PO TBPK: ORAL_TABLET | ORAL | Status: DC

## 2015-05-26 MED ORDER — DIPHENHYDRAMINE HCL 25 MG PO CAPS: 25.0000 mg | ORAL_CAPSULE | Freq: Four times a day (QID) | ORAL | Status: DC | PRN

## 2015-05-26 MED ORDER — LACTULOSE 20 GM/30ML PO SOLN: 30.0000 mL | Freq: Two times a day (BID) | ORAL | Status: DC | PRN

## 2015-05-26 NOTE — Patient Instructions (Signed)
Angioedema  Angioedema is a sudden swelling of tissues, often of the skin. It can occur on the face or genitals or in the abdomen or other body parts. The swelling usually develops over a short period and gets better in 24 to 48 hours. It often begins during the night and is found when the person wakes up. The person may also get red, itchy patches of skin (hives). Angioedema can be dangerous if it involves swelling of the air passages.   Depending on the cause, episodes of angioedema may only happen once, come back in unpredictable patterns, or repeat for several years and then gradually fade away.   CAUSES   Angioedema can be caused by an allergic reaction to various triggers. It can also result from nonallergic causes, including reactions to drugs, immune system disorders, viral infections, or an abnormal gene that is passed to you from your parents (hereditary). For some people with angioedema, the cause is unknown.   Some things that can trigger angioedema include:    Foods.    Medicines, such as ACE inhibitors, ARBs, nonsteroidal anti-inflammatory agents, or estrogen.    Latex.    Animal saliva.    Insect stings.    Dyes used in X-rays.    Mild injury.    Dental work.   Surgery.   Stress.    Sudden changes in temperature.    Exercise.  SIGNS AND SYMPTOMS    Swelling of the skin.   Hives. If these are present, there is also intense itching.   Redness in the affected area.    Pain in the affected area.   Swollen lips or tongue.   Breathing problems. This may happen if the air passages swell.   Wheezing.  If internal organs are involved, there may be:    Nausea.    Abdominal pain.    Vomiting.    Difficulty swallowing.    Difficulty passing urine.  DIAGNOSIS    Your health care provider will examine the affected area and take a medical and family history.   Various tests may be done to help determine the cause. Tests may include:   Allergy skin tests to see if the problem  is an allergic reaction.    Blood tests to check for hereditary angioedema.    Tests to check for underlying diseases that could cause the condition.    A review of your medicines, including over-the-counter medicines, may be done.  TREATMENT   Treatment will depend on the cause of the angioedema. Possible treatments include:    Removal of anything that triggered the condition (such as stopping certain medicines).    Medicines to treat symptoms or prevent attacks. Medicines given may include:     Antihistamines.     Epinephrine injection.     Steroids.    Hospitalization may be required for severe attacks. If the air passages are affected, it can be an emergency. Tubes may need to be placed to keep the airway open.  HOME CARE INSTRUCTIONS    Take all medicines as directed by your health care provider.   If you were given medicines for emergency allergy treatment, always carry them with you.   Wear a medical bracelet as directed by your health care provider.    Avoid known triggers.  SEEK MEDICAL CARE IF:    You have repeat attacks of angioedema.    Your attacks are more frequent or more severe despite preventive measures.    You have hereditary angioedema   and are considering having children. It is important to discuss with your health care provider the risks of passing the condition on to your children.  SEEK IMMEDIATE MEDICAL CARE IF:    You have severe swelling of the mouth, tongue, or lips.   You have difficulty breathing.    You have difficulty swallowing.    You faint.  MAKE SURE YOU:   Understand these instructions.   Will watch your condition.   Will get help right away if you are not doing well or get worse.     This information is not intended to replace advice given to you by your health care provider. Make sure you discuss any questions you have with your health care provider.     Document Released: 08/01/2001 Document Revised: 06/13/2014 Document Reviewed:  01/14/2013  Elsevier Interactive Patient Education 2016 Elsevier Inc.

## 2015-05-26 NOTE — Telephone Encounter (Signed)
Pt was seen a couple days ago with bumps on her lip. Pt called stating the bumps have gotten worse and around her eye. Pt has been scheduled a office visit today.

## 2015-05-26 NOTE — Progress Notes (Signed)
Pt is still having rash/sores around mouth and now has spread to her eyes. She states she was only given 4 tablets of Valtrex which she took as one tablet twice daily.  [place] for growth tomorrow.

## 2015-05-26 NOTE — Progress Notes (Signed)
Subjective:  Rebecca Hancock is a 34 y.o. G1P0 at [redacted]w[redacted]d being seen today for worsening facial rash.  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  Patient reports worsening of facial rash, now on right ear and bilateral periorbital areas.   Has used Valtrex as prescribed but areas are increasing and are now more itchy and reddened with swelling.  Contractions: Irritability. Vag. Bleeding: None.  Movement: Present. Denies leaking of fluid.   The following portions of the patient's history were reviewed and updated as appropriate: allergies, current medications, past family history, past medical history, past social history, past surgical history and problem list. Problem list updated.  Objective:   Filed Vitals:   05/26/15 1431  BP: 123/70  Pulse: 74  Temp: 97.6 F (36.4 C)  Weight: 180 lb 14.4 oz (82.056 kg)    Fetal Status: Fetal Heart Rate (bpm): 132   Movement: Present     General:  Alert, oriented and cooperative. Patient is in no acute distress.  Skin: Skin is warm and dry. No rash noted.   Cardiovascular: Normal heart rate noted  Respiratory: Normal respiratory effort, no problems with respiration noted  Abdomen: Soft, gravid, appropriate for gestational age. Pain/Pressure: Present     Pelvic: Vag. Bleeding: None     Cervical exam deferred        Extremities: Normal range of motion.  Edema: None  Mental Status: Normal mood and affect. Normal behavior. Normal judgment and thought content.   Urinalysis: Urine Protein: Negative Urine Glucose: Negative  Assessment and Plan:  Pregnancy: G1P0 at [redacted]w[redacted]d  1. Allergic angioedema, subsequent encounter     Erethematous areas on  bilateral periorbital areas, nose, lips and area around lips, and right ear.  There are some small papules but they do not look like HSV1. No crusting.      Consulted Dr Ernestina Patches who agrees this looks like allergic angioedema    HSV culture sent just incase.  - Viral culture      Will Rx benadryl for itching and Medrol Dosepack.       Instructed pt to come back this week if worsens. Suspect if this is indeed angioedema, it should improve over next 2 days.  2. Constipation due to opioid therapy      Reordered lactulose per patient request (states works better than miralax) - Lactulose 20 GM/30ML SOLN; Take 30 mLs (20 g total) by mouth 2 (two) times daily as needed (constipation).  Dispense: 946 mL; Refill: 2  Preterm labor symptoms and general obstetric precautions including but not limited to vaginal bleeding, contractions, leaking of fluid and fetal movement were reviewed in detail with the patient. Please refer to After Visit Summary for other counseling recommendations.  Return in about 9 days (around 06/04/2015) for as scheduled.   Seabron Spates, CNM

## 2015-05-27 ENCOUNTER — Ambulatory Visit (HOSPITAL_COMMUNITY): Payer: Medicaid Other

## 2015-05-27 ENCOUNTER — Ambulatory Visit (HOSPITAL_COMMUNITY)
Admission: RE | Admit: 2015-05-27 | Discharge: 2015-05-27 | Disposition: A | Discharge status: Home / Self Care | Payer: Medicaid Other | Source: Ambulatory Visit | Attending: Maternal and Fetal Medicine | Admitting: Maternal and Fetal Medicine

## 2015-05-27 DIAGNOSIS — F1110 Opioid abuse, uncomplicated: Secondary | ICD-10-CM | POA: Insufficient documentation

## 2015-05-27 DIAGNOSIS — F1120 Opioid dependence, uncomplicated: Secondary | ICD-10-CM

## 2015-05-27 DIAGNOSIS — O3413 Maternal care for benign tumor of corpus uteri, third trimester: Secondary | ICD-10-CM | POA: Insufficient documentation

## 2015-05-27 DIAGNOSIS — D259 Leiomyoma of uterus, unspecified: Secondary | ICD-10-CM | POA: Insufficient documentation

## 2015-05-27 DIAGNOSIS — O99323 Drug use complicating pregnancy, third trimester: Secondary | ICD-10-CM | POA: Diagnosis present

## 2015-05-27 DIAGNOSIS — Z3A31 31 weeks gestation of pregnancy: Secondary | ICD-10-CM | POA: Diagnosis not present

## 2015-05-27 DIAGNOSIS — O99320 Drug use complicating pregnancy, unspecified trimester: Secondary | ICD-10-CM

## 2015-05-27 IMAGING — US US MFM OB FOLLOW-UP
1 series · 14 of 28 positions shown · non-contrast
Comparison: none

[Series 1: us mfm ob follow-up · 31 acquisitions, 14 frames shown]
[im 2/31]
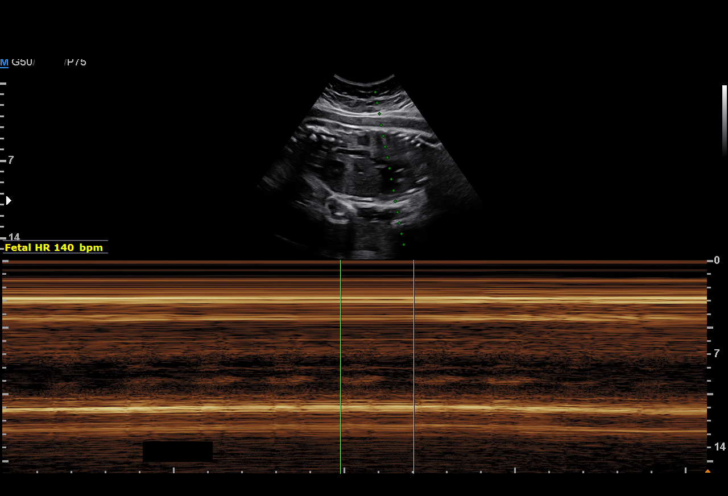
[im 4/31]
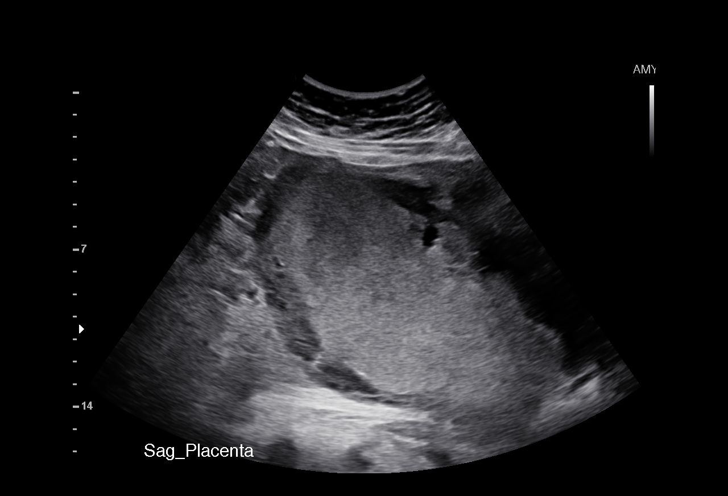
[im 6/31]
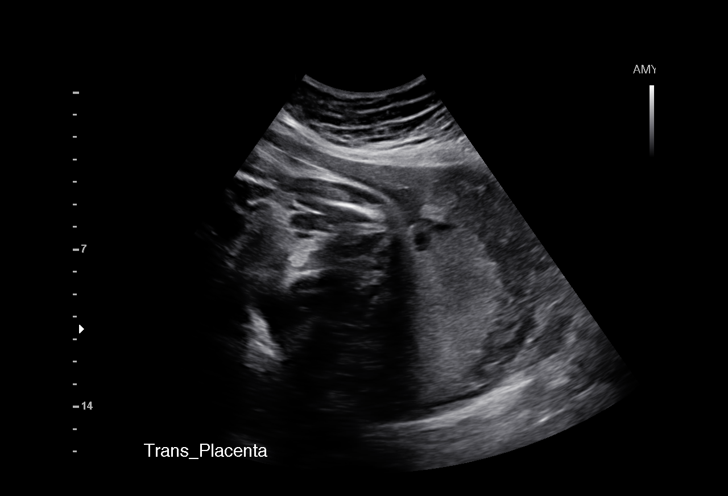
[im 8/31]
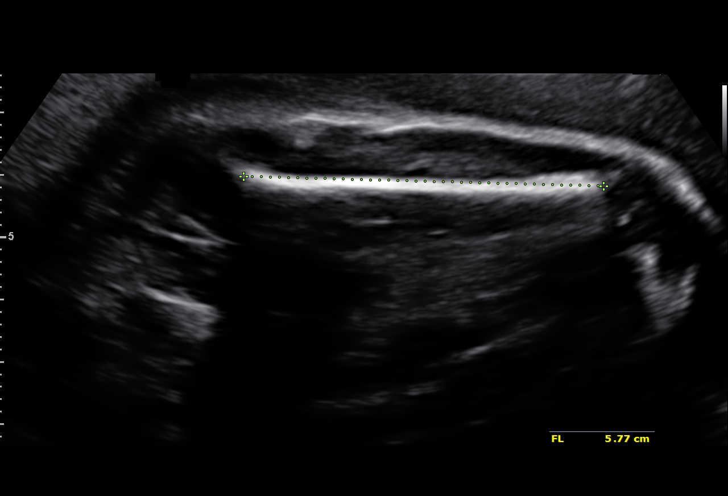
[im 11/31]
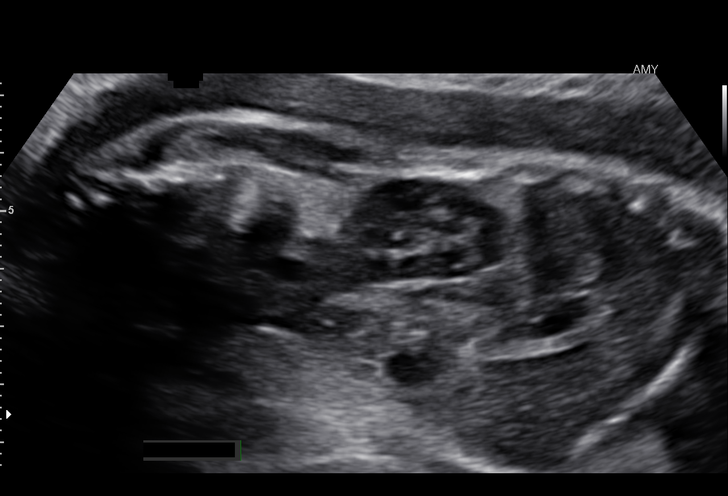
[im 13/31]
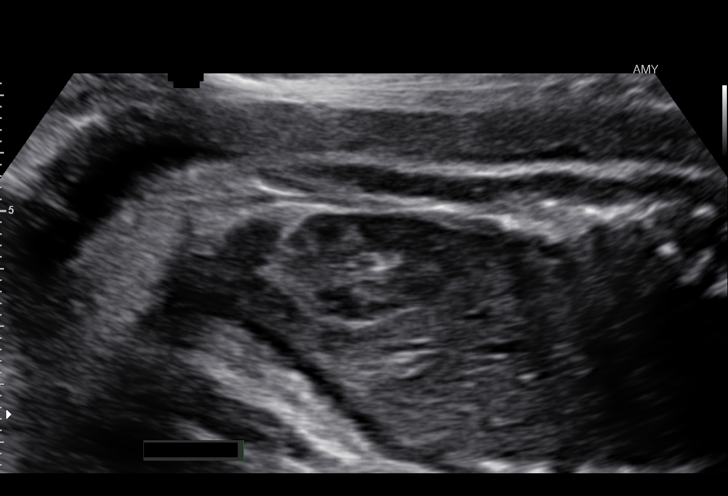
[im 15/31]
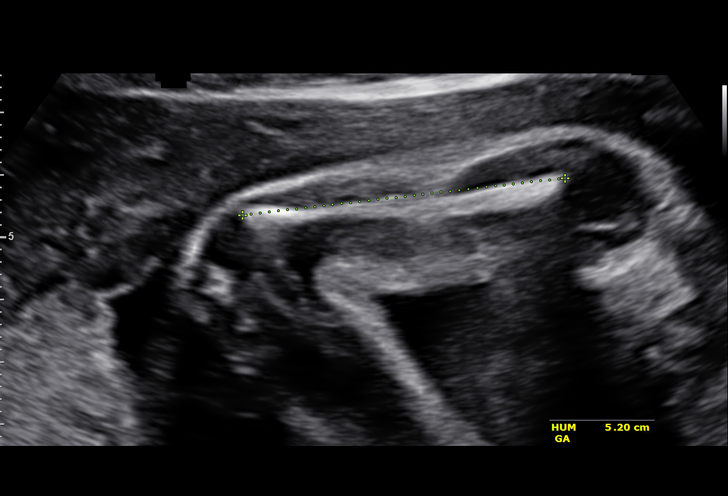
[im 17/31]
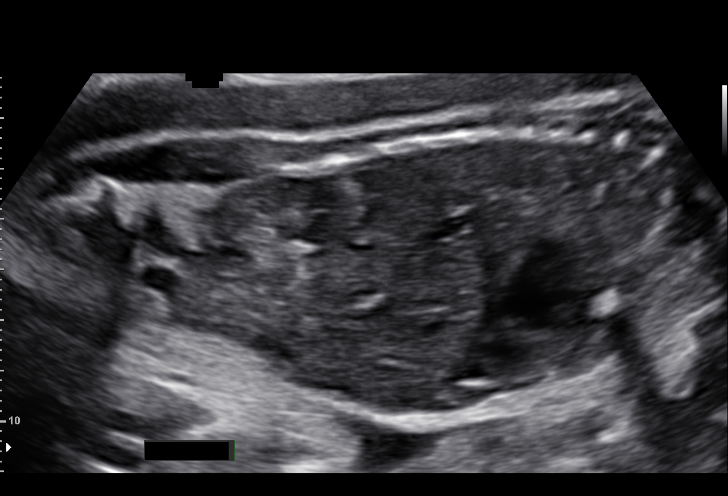
[im 19/31]
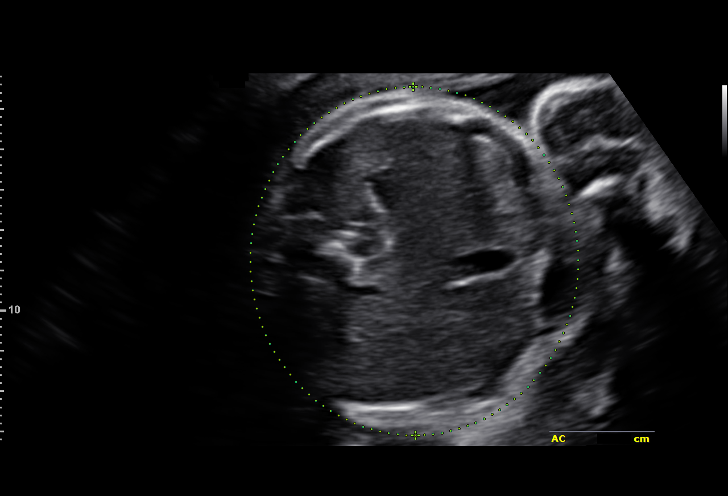
[im 22/31]
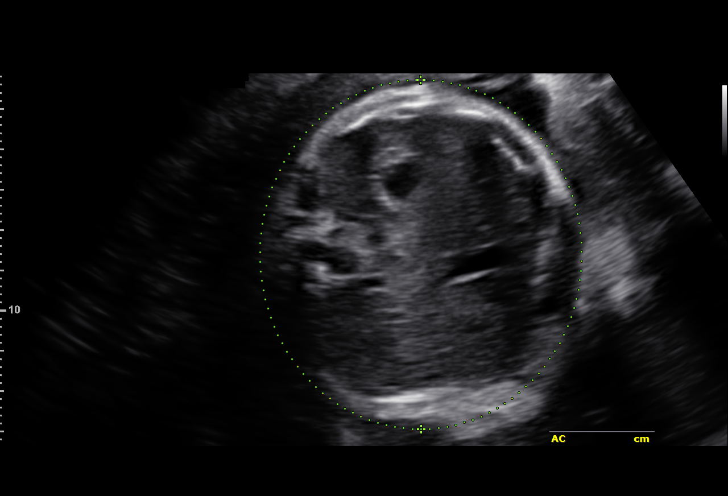
[im 24/31]
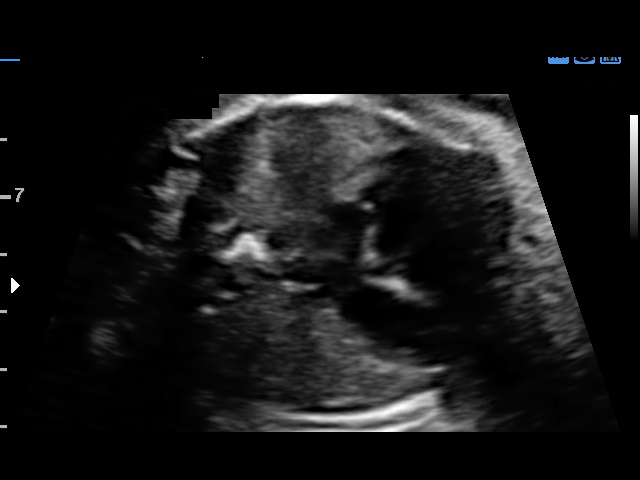
[im 26/31]
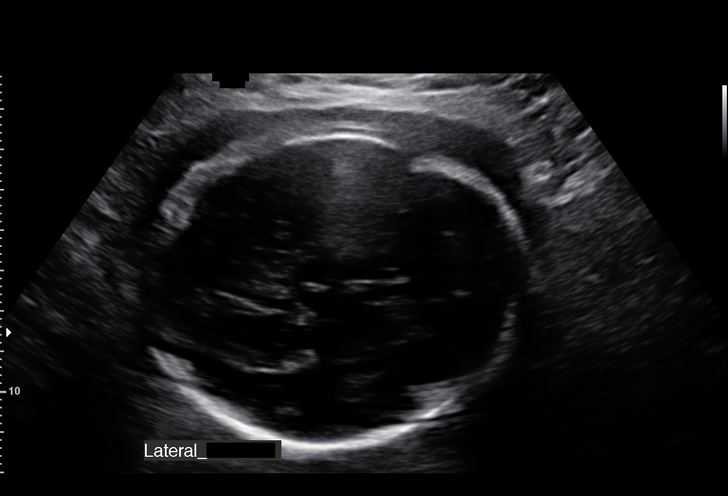
[im 28/31]
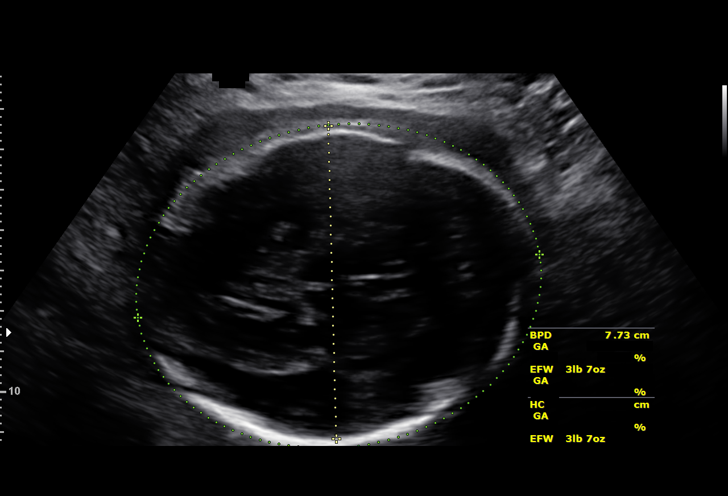
[im 31/31]
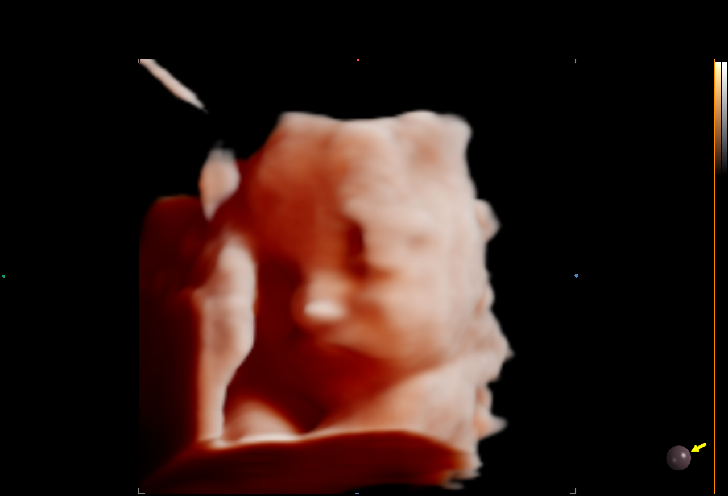

[14 of 28 positions shown; findings below may reference images not displayed]

pm)

Date:

Faculty Physician
Clinic
[REDACTED]

1  AYO            [PHONE_NUMBER]       [PHONE_NUMBER]     [PHONE_NUMBER]
Indications

Heroin/Methadone use                            [HB] [HB]
31 weeks gestation of pregnancy
Uterine fibroids affecting pregnancy in third   O34.13, [HB]
trimester, antepartum
OB History

Gravidity:     1         Term:  0        Prem:    0        SAB:   0
TOP:           0       Ectopic  0        Living:  0
:
Fetal Evaluation

Num Of Fetuses:      1
Fetal Heart          140
Rate(bpm):
Cardiac Activity:    Observed
Presentation:        Cephalic
Placenta:            Posterior, above cervical os
P. Cord Insertion:   Previously Visualized

Amniotic Fluid
AFI FV:      Subjectively within normal limits
Larg Pckt:      3.6  cm
Biometry

BPD:        77  mm     G. Age:   30w 6d                  CI:        74.21   %    70 - 86
FL/HC:      20.0   %    19.3 -
HC:      283.8  mm     G. Age:   31w 1d        14   %    HC/AC:      1.08        0.96 -
AC:      262.1  mm     G. Age:   30w 2d        22   %    FL/BPD      73.9   %    71 - 87
:
FL:       56.9  mm     G. Age:   29w 6d          8  %    FL/AC:      21.7   %    20 - 24
HUM:      50.9  mm     G. Age:   29w 6d        20   %

Est.        [HB]   gm    3 lb 7 oz      35   %
FW:
Gestational Age

U/S Today:     30w 4d                                         EDD:   [DATE]
Anatomy

Cranium:          Previously seen        Aortic Arch:       Previously seen
Fetal Cavum:      Previously seen        Ductal Arch:       Previously seen
Ventricles:       Appears normal         Diaphragm:         Appears normal
Choroid Plexus:   Previously seen        Stomach:           Appears normal,
left sided
Cerebellum:       Previously seen        Abdomen:           Previously seen
Posterior         Previously seen        Abdominal          Previously seen
Fossa:                                   Wall:
Nuchal Fold:      Previously seen        Cord Vessels:      Previously seen
Face:             Orbits and profile     Kidneys:           Appear normal
previously seen
Lips:             Previously seen        Bladder:           Appears normal
Heart:            Appears normal         Spine:             Previously seen
(4CH, axis, and
situs)
RVOT:             Previously seen        Upper              Previously seen
Extremities:
LVOT:             Previously seen        Lower              Previously seen
Extremities:

Other:   Female gender.. Heels and 5th digit previously seen.
Cervix Uterus Adnexa

Cervix
Not visualized (advanced GA >[HB])
Myomas

Site                     L(cm)      W(cm)       D(cm)      Location
Anterior
Blood Flow                  RI       PI        Comments

Impression

Single IUP at 31w 2d
Hx of Methadone use
Fetal growth is appropriate (35th %tile)
Posterior placenta without previa
Normal amniotic fluid volume
Recommendations

Recommend follow-up ultrasound examination in 4 weeks
for growth

## 2015-05-30 LAB — RFLXH. SIMPLEX/VZ VIRUS CULT/DIF

## 2015-06-04 ENCOUNTER — Ambulatory Visit (INDEPENDENT_AMBULATORY_CARE_PROVIDER_SITE_OTHER): Payer: Medicaid Other | Admitting: Family Medicine

## 2015-06-04 VITALS — BP 121/77 | HR 73 | Temp 97.9°F | Wt 181.0 lb

## 2015-06-04 DIAGNOSIS — F1120 Opioid dependence, uncomplicated: Secondary | ICD-10-CM | POA: Diagnosis not present

## 2015-06-04 DIAGNOSIS — O99320 Drug use complicating pregnancy, unspecified trimester: Secondary | ICD-10-CM

## 2015-06-04 DIAGNOSIS — O0973 Supervision of high risk pregnancy due to social problems, third trimester: Secondary | ICD-10-CM

## 2015-06-04 DIAGNOSIS — O99323 Drug use complicating pregnancy, third trimester: Secondary | ICD-10-CM

## 2015-06-04 LAB — POCT URINALYSIS DIP (DEVICE)
BILIRUBIN URINE: NEGATIVE
GLUCOSE, UA: NEGATIVE mg/dL
Hgb urine dipstick: NEGATIVE
KETONES UR: NEGATIVE mg/dL
LEUKOCYTES UA: NEGATIVE
Nitrite: NEGATIVE
Protein, ur: NEGATIVE mg/dL
SPECIFIC GRAVITY, URINE: 1.015 (ref 1.005–1.030)
UROBILINOGEN UA: 0.2 mg/dL (ref 0.0–1.0)
pH: 5 (ref 5.0–8.0)

## 2015-06-04 NOTE — Patient Instructions (Signed)

## 2015-06-04 NOTE — Progress Notes (Signed)
C/o braxton hicks 3-4 times a day. C/o hemorrhoids. Denies exam. Is using hemorrhoid cream, tucks, sometimes suppositories.

## 2015-06-04 NOTE — Progress Notes (Signed)
Subjective:  Rebecca Hancock is a 34 y.o. G1P0 at [redacted]w[redacted]d being seen today for ongoing prenatal care.  She is currently monitored for the following issues for this high-risk pregnancy and has Polysubstance abuse; Elevated blood pressure; Depression; Anxiety; Insomnia; Supervision of high risk pregnancy due to social problems, antepartum; Methadone maintenance treatment affecting pregnancy, antepartum ([place]); and Allergic angioedema on her problem list.  On methadone 99mg  daily. Has some fatigue.  Had recent increase, but feels that this is too much.  Patient reports no complaints.  Contractions: Irregular. Vag. Bleeding: None.  Movement: Present. Denies leaking of fluid.   The following portions of the patient's history were reviewed and updated as appropriate: allergies, current medications, past family history, past medical history, past social history, past surgical history and problem list. Problem list updated.  Objective:   Filed Vitals:   06/04/15 1129  BP: 121/77  Pulse: 73  Temp: 97.9 F (36.6 C)  Weight: 181 lb (82.101 kg)    Fetal Status: Fetal Heart Rate (bpm): 132 Fundal Height: 33 cm Movement: Present     General:  Alert, oriented and cooperative. Patient is in no acute distress.  Skin: Skin is warm and dry. No rash noted.   Cardiovascular: Normal heart rate noted  Respiratory: Normal respiratory effort, no problems with respiration noted  Abdomen: Soft, gravid, appropriate for gestational age. Pain/Pressure: Present     Pelvic: Vag. Bleeding: None     Cervical exam deferred        Extremities: Normal range of motion.  Edema: None  Mental Status: Normal mood and affect. Normal behavior. Normal judgment and thought content.   Urinalysis:      Assessment and Plan:  Pregnancy: G1P0 at [redacted]w[redacted]d  1. Supervision of high risk pregnancy due to social problems, antepartum, third trimester FHT and FH appropriate  2. Methadone maintenance treatment affecting pregnancy, antepartum  ([place]) Stable - has appt with MD at methadone clinic to discuss dose.  Preterm labor symptoms and general obstetric precautions including but not limited to vaginal bleeding, contractions, leaking of fluid and fetal movement were reviewed in detail with the patient. Please refer to After Visit Summary for other counseling recommendations.  No Follow-up on file.   Truett Mainland, DO

## 2015-06-07 NOTE — L&D Delivery Note (Signed)
Delivery Note Called to room, patient actively pushing and making good progress. Patient continued to push.  At 11:36 PM a viable female was delivered via Vaginal, Spontaneous Delivery (Presentation: LOA).  APGAR: 8,9 ; weight pending  .   Placenta status: Intact, Spontaneous.  Cord: 3 vessels with the following complications: None.  Cord pH: pending  Anesthesia: Epidural Local  Episiotomy: None Lacerations: small periurethral tear and 1st degree perineal  Suture Repair: 2.0 vicryl Est. Blood Loss (mL):  300  Mom to postpartum.  Baby to Couplet care / Skin to Skin.  Crystal Dorsey 07/26/2015, 12:35 AM   OB fellow attestation: Patient is a G1P1001 at [redacted]w[redacted]d who was admitted for IOL due to [facility], significant hx of methadone use and depression. During the course of her labor she developed severe range pressures as well as proteinuria (UPC 0.31) She was put on magnesium.  She was induced with cytotec, FB, and pitocin. In the last first phase she started having recurrent variables and an amnioinfusion was started. She progressed somewhat slowly through the first stage of labor but made adequate change.  I was gloved and present for delivery in its entirety.  Second stage of labor progressed. Variable decels during second stage noted.  Complications: none  Lacerations: 1st degree perineal - repaired with 1 interrupted and 1 figure of eight and right periurethral tear- 1  interrupted stitch.   EBL:300  Caren Macadam, MD 6:59 AM

## 2015-06-08 LAB — VIRAL CULTURE VIRC

## 2015-06-18 ENCOUNTER — Ambulatory Visit (INDEPENDENT_AMBULATORY_CARE_PROVIDER_SITE_OTHER): Payer: Medicaid Other | Admitting: Family Medicine

## 2015-06-18 VITALS — BP 136/74 | HR 94 | Temp 97.7°F | Wt 184.5 lb

## 2015-06-18 DIAGNOSIS — F1120 Opioid dependence, uncomplicated: Secondary | ICD-10-CM | POA: Diagnosis not present

## 2015-06-18 DIAGNOSIS — F1910 Other psychoactive substance abuse, uncomplicated: Secondary | ICD-10-CM

## 2015-06-18 DIAGNOSIS — O99320 Drug use complicating pregnancy, unspecified trimester: Secondary | ICD-10-CM

## 2015-06-18 DIAGNOSIS — O99323 Drug use complicating pregnancy, third trimester: Secondary | ICD-10-CM | POA: Diagnosis not present

## 2015-06-18 DIAGNOSIS — O0973 Supervision of high risk pregnancy due to social problems, third trimester: Secondary | ICD-10-CM

## 2015-06-18 LAB — POCT URINALYSIS DIP (DEVICE)
Bilirubin Urine: NEGATIVE
Glucose, UA: NEGATIVE mg/dL
HGB URINE DIPSTICK: NEGATIVE
Ketones, ur: NEGATIVE mg/dL
LEUKOCYTES UA: NEGATIVE
NITRITE: NEGATIVE
PH: 5.5 (ref 5.0–8.0)
Protein, ur: NEGATIVE mg/dL
SPECIFIC GRAVITY, URINE: 1.02 (ref 1.005–1.030)
UROBILINOGEN UA: 0.2 mg/dL (ref 0.0–1.0)

## 2015-06-18 NOTE — Progress Notes (Signed)
Subjective:  Rebecca Hancock is a 35 y.o. G1P0 at [redacted]w[redacted]d being seen today for ongoing prenatal care.  She is currently monitored for the following issues for this high-risk pregnancy and has Polysubstance abuse; Elevated blood pressure; Depression; Anxiety; Insomnia; Supervision of high risk pregnancy due to social problems, antepartum; Methadone maintenance treatment affecting pregnancy, antepartum ([place]); and Allergic angioedema on her problem list.  Patient reports no complaints.  Contractions: Irregular. Vag. Bleeding: None.  Movement: Present. Denies leaking of fluid.   Rash and itching- started in the last week. Not tried anything.   The following portions of the patient's history were reviewed and updated as appropriate: allergies, current medications, past family history, past medical history, past social history, past surgical history and problem list. Problem list updated.  Objective:   Filed Vitals:   06/18/15 1057  BP: 136/74  Pulse: 94  Temp: 97.7 F (36.5 C)  Weight: 184 lb 8 oz (83.689 kg)    Fetal Status: Fetal Heart Rate (bpm): 137 Fundal Height: 34 cm Movement: Present     General:  Alert, oriented and cooperative. Patient is in no acute distress.  Skin: Skin is warm and dry. No rash noted.   Cardiovascular: Normal heart rate noted  Respiratory: Normal respiratory effort, no problems with respiration noted  Abdomen: Soft, gravid, appropriate for gestational age. Pain/Pressure: Present     Pelvic: Vag. Bleeding: None     Cervical exam deferred        Extremities: Normal range of motion.     Mental Status: Normal mood and affect. Normal behavior. Normal judgment and thought content.   Urinalysis:      Assessment and Plan:  Pregnancy: G1P0 at [redacted]w[redacted]d  1. Polysubstance abuse  2. Supervision of high risk pregnancy due to social problems, antepartum, third trimester Updated box Discussed contraception briefly  3. Methadone maintenance treatment affecting pregnancy,  antepartum ([place]) Stable dose currently Discussed infant staying in hospital to monitor withdrawl  Preterm labor symptoms and general obstetric precautions including but not limited to vaginal bleeding, contractions, leaking of fluid and fetal movement were reviewed in detail with the patient. Please refer to After Visit Summary for other counseling recommendations.  Return in about 2 weeks (around 07/02/2015) for Routine prenatal care.   Caren Macadam, MD

## 2015-06-18 NOTE — Progress Notes (Signed)
Currently on methadone 94 mg daily. Patient also c/o itchy rash on back and hands.

## 2015-06-24 ENCOUNTER — Encounter (HOSPITAL_COMMUNITY): Payer: Self-pay

## 2015-06-24 ENCOUNTER — Other Ambulatory Visit (HOSPITAL_COMMUNITY): Payer: Self-pay | Admitting: Maternal and Fetal Medicine

## 2015-06-24 ENCOUNTER — Ambulatory Visit (HOSPITAL_COMMUNITY)
Admission: RE | Admit: 2015-06-24 | Discharge: 2015-06-24 | Disposition: A | Discharge status: Home / Self Care | Payer: Medicaid Other | Source: Ambulatory Visit | Attending: Obstetrics & Gynecology | Admitting: Obstetrics & Gynecology

## 2015-06-24 DIAGNOSIS — Z3A35 35 weeks gestation of pregnancy: Secondary | ICD-10-CM

## 2015-06-24 DIAGNOSIS — F1120 Opioid dependence, uncomplicated: Secondary | ICD-10-CM

## 2015-06-24 DIAGNOSIS — O3413 Maternal care for benign tumor of corpus uteri, third trimester: Secondary | ICD-10-CM | POA: Insufficient documentation

## 2015-06-24 DIAGNOSIS — D259 Leiomyoma of uterus, unspecified: Secondary | ICD-10-CM | POA: Diagnosis not present

## 2015-06-24 DIAGNOSIS — O99323 Drug use complicating pregnancy, third trimester: Secondary | ICD-10-CM | POA: Diagnosis not present

## 2015-06-24 DIAGNOSIS — O99320 Drug use complicating pregnancy, unspecified trimester: Secondary | ICD-10-CM

## 2015-06-24 DIAGNOSIS — F1110 Opioid abuse, uncomplicated: Secondary | ICD-10-CM | POA: Insufficient documentation

## 2015-07-02 ENCOUNTER — Ambulatory Visit (INDEPENDENT_AMBULATORY_CARE_PROVIDER_SITE_OTHER): Payer: Medicaid Other | Admitting: Family Medicine

## 2015-07-02 ENCOUNTER — Other Ambulatory Visit (HOSPITAL_COMMUNITY)
Admission: RE | Admit: 2015-07-02 | Discharge: 2015-07-02 | Disposition: A | Discharge status: Home / Self Care | Payer: Medicaid Other | Source: Ambulatory Visit | Attending: Family Medicine | Admitting: Family Medicine

## 2015-07-02 VITALS — BP 133/83 | HR 74 | Temp 98.0°F | Wt 189.8 lb

## 2015-07-02 DIAGNOSIS — F418 Other specified anxiety disorders: Secondary | ICD-10-CM | POA: Diagnosis not present

## 2015-07-02 DIAGNOSIS — O99323 Drug use complicating pregnancy, third trimester: Secondary | ICD-10-CM | POA: Diagnosis not present

## 2015-07-02 DIAGNOSIS — T402X5A Adverse effect of other opioids, initial encounter: Secondary | ICD-10-CM

## 2015-07-02 DIAGNOSIS — Z113 Encounter for screening for infections with a predominantly sexual mode of transmission: Secondary | ICD-10-CM

## 2015-07-02 DIAGNOSIS — O0973 Supervision of high risk pregnancy due to social problems, third trimester: Secondary | ICD-10-CM | POA: Diagnosis present

## 2015-07-02 DIAGNOSIS — O99343 Other mental disorders complicating pregnancy, third trimester: Secondary | ICD-10-CM | POA: Diagnosis not present

## 2015-07-02 DIAGNOSIS — F1190 Opioid use, unspecified, uncomplicated: Secondary | ICD-10-CM | POA: Diagnosis not present

## 2015-07-02 DIAGNOSIS — F1120 Opioid dependence, uncomplicated: Secondary | ICD-10-CM

## 2015-07-02 DIAGNOSIS — O99320 Drug use complicating pregnancy, unspecified trimester: Secondary | ICD-10-CM

## 2015-07-02 DIAGNOSIS — R51 Headache: Secondary | ICD-10-CM

## 2015-07-02 DIAGNOSIS — R21 Rash and other nonspecific skin eruption: Secondary | ICD-10-CM

## 2015-07-02 DIAGNOSIS — K5903 Drug induced constipation: Secondary | ICD-10-CM

## 2015-07-02 LAB — POCT URINALYSIS DIP (DEVICE)
BILIRUBIN URINE: NEGATIVE
GLUCOSE, UA: NEGATIVE mg/dL
HGB URINE DIPSTICK: NEGATIVE
KETONES UR: NEGATIVE mg/dL
LEUKOCYTES UA: NEGATIVE
Nitrite: NEGATIVE
Protein, ur: NEGATIVE mg/dL
SPECIFIC GRAVITY, URINE: 1.015 (ref 1.005–1.030)
Urobilinogen, UA: 0.2 mg/dL (ref 0.0–1.0)
pH: 6 (ref 5.0–8.0)

## 2015-07-02 LAB — OB RESULTS CONSOLE GBS: GBS: NEGATIVE

## 2015-07-02 LAB — OB RESULTS CONSOLE GC/CHLAMYDIA: Gonorrhea: NEGATIVE

## 2015-07-02 MED ORDER — METHYLPREDNISOLONE 4 MG PO TBPK: ORAL_TABLET | ORAL | Status: DC

## 2015-07-02 MED ORDER — LACTULOSE 20 GM/30ML PO SOLN: 30.0000 mL | Freq: Two times a day (BID) | ORAL | Status: DC | PRN

## 2015-07-02 NOTE — Progress Notes (Signed)
Subjective:  Rebecca Hancock is a 35 y.o. G1P0 at [redacted]w[redacted]d being seen today for ongoing prenatal care.  She is currently monitored for the following issues for this high-risk pregnancy and has Polysubstance abuse; Elevated blood pressure; Depression; Anxiety; Insomnia; Supervision of high risk pregnancy due to social problems, antepartum; Methadone maintenance treatment affecting pregnancy, antepartum ([place]); and Allergic angioedema on her problem list.  Patient reports rash, itchy and diffuse.  Contractions: Irregular. Vag. Bleeding: None.  Movement: Present. Denies leaking of fluid.   The following portions of the patient's history were reviewed and updated as appropriate: allergies, current medications, past family history, past medical history, past social history, past surgical history and problem list. Problem list updated.  Objective:   Filed Vitals:   07/02/15 1053  BP: 142/79  Pulse: 74  Temp: 98 F (36.7 C)  Weight: 189 lb 12.8 oz (86.093 kg)    Fetal Status: Fetal Heart Rate (bpm): 125 Fundal Height: 35 cm Movement: Present  Presentation: Vertex  General:  Alert, oriented and cooperative. Patient is in no acute distress.  Skin: Skin is warm and dry. Diffuse papular rash with excoriation noted   Cardiovascular: Normal heart rate noted  Respiratory: Normal respiratory effort, no problems with respiration noted  Abdomen: Soft, gravid, appropriate for gestational age. Pain/Pressure: Present     Pelvic: Vag. Bleeding: None     Cervical exam performed Dilation: Closed Effacement (%): 20 Station: -1  Extremities: Normal range of motion.  Edema: Trace  Mental Status: Normal mood and affect. Normal behavior. Normal judgment and thought content.   Urinalysis: Urine Protein: Negative Urine Glucose: Negative  Assessment and Plan:  Pregnancy: G1P0 at [redacted]w[redacted]d  1. Constipation due to opioid therapy Refilled meds - Lactulose 20 GM/30ML SOLN; Take 30 mLs (20 g total) by mouth 2 (two) times  daily as needed (constipation).  Dispense: 946 mL; Refill: 2  2. Supervision of high risk pregnancy due to social problems, antepartum, third trimester Continue prenatal care.  - Culture, beta strep (group b only) - GC/Chlamydia probe amp ([place])not at [facility]  3. Methadone maintenance treatment affecting pregnancy, antepartum ([facility]) Stable on current dose  4. Rash and nonspecific skin eruption Unclear etiology---using steroid cream--but too diffuse will try systemic dose pak - methylPREDNISolone (MEDROL DOSEPAK) 4 MG TBPK tablet; Take 5, then 4, then 3, then 2, then 1  Dispense: 15 tablet; Refill: 0  Term labor symptoms and general obstetric precautions including but not limited to vaginal bleeding, contractions, leaking of fluid and fetal movement were reviewed in detail with the patient. Please refer to After Visit Summary for other counseling recommendations.  Return in 1 week (on 07/09/2015).   Donnamae Jude, MD

## 2015-07-02 NOTE — Patient Instructions (Signed)
Third Trimester of Pregnancy The third trimester is from week 29 through week 42, months 7 through 9. The third trimester is a time when the fetus is growing rapidly. At the end of the ninth month, the fetus is about 20 inches in length and weighs 6-10 pounds.  BODY CHANGES Your body goes through many changes during pregnancy. The changes vary from woman to woman.   Your weight will continue to increase. You can expect to gain 25-35 pounds (11-16 kg) by the end of the pregnancy.  You may begin to get stretch marks on your hips, abdomen, and breasts.  You may urinate more often because the fetus is moving lower into your pelvis and pressing on your bladder.  You may develop or continue to have heartburn as a result of your pregnancy.  You may develop constipation because certain hormones are causing the muscles that push waste through your intestines to slow down.  You may develop hemorrhoids or swollen, bulging veins (varicose veins).  You may have pelvic pain because of the weight gain and pregnancy hormones relaxing your joints between the bones in your pelvis. Backaches may result from overexertion of the muscles supporting your posture.  You may have changes in your hair. These can include thickening of your hair, rapid growth, and changes in texture. Some women also have hair loss during or after pregnancy, or hair that feels dry or thin. Your hair will most likely return to normal after your baby is born.  Your breasts will continue to grow and be tender. A yellow discharge may leak from your breasts called colostrum.  Your belly button may stick out.  You may feel short of breath because of your expanding uterus.  You may notice the fetus "dropping," or moving lower in your abdomen.  You may have a bloody mucus discharge. This usually occurs a few days to a week before labor begins.  Your cervix becomes thin and soft (effaced) near your due date. WHAT TO EXPECT AT YOUR  PRENATAL EXAMS  You will have prenatal exams every 2 weeks until week 36. Then, you will have weekly prenatal exams. During a routine prenatal visit:  You will be weighed to make sure you and the fetus are growing normally.  Your blood pressure is taken.  Your abdomen will be measured to track your baby's growth.  The fetal heartbeat will be listened to.  Any test results from the previous visit will be discussed.  You may have a cervical check near your due date to see if you have effaced. At around 36 weeks, your caregiver will check your cervix. At the same time, your caregiver will also perform a test on the secretions of the vaginal tissue. This test is to determine if a type of bacteria, Group B streptococcus, is present. Your caregiver will explain this further. Your caregiver may ask you:  What your birth plan is.  How you are feeling.  If you are feeling the baby move.  If you have had any abnormal symptoms, such as leaking fluid, bleeding, severe headaches, or abdominal cramping.  If you are using any tobacco products, including cigarettes, chewing tobacco, and electronic cigarettes.  If you have any questions. Other tests or screenings that may be performed during your third trimester include:  Blood tests that check for low iron levels (anemia).  Fetal testing to check the health, activity level, and growth of the fetus. Testing is done if you have certain medical conditions or if   there are problems during the pregnancy.  HIV (human immunodeficiency virus) testing. If you are at high risk, you may be screened for HIV during your third trimester of pregnancy. FALSE LABOR You may feel small, irregular contractions that eventually go away. These are called Braxton Hicks contractions, or false labor. Contractions may last for hours, days, or even weeks before true labor sets in. If contractions come at regular intervals, intensify, or become painful, it is best to be seen  by your caregiver.  SIGNS OF LABOR   Menstrual-like cramps.  Contractions that are 5 minutes apart or less.  Contractions that start on the top of the uterus and spread down to the lower abdomen and back.  A sense of increased pelvic pressure or back pain.  A watery or bloody mucus discharge that comes from the vagina. If you have any of these signs before the 37th week of pregnancy, call your caregiver right away. You need to go to the hospital to get checked immediately. HOME CARE INSTRUCTIONS   Avoid all smoking, herbs, alcohol, and unprescribed drugs. These chemicals affect the formation and growth of the baby.  Do not use any tobacco products, including cigarettes, chewing tobacco, and electronic cigarettes. If you need help quitting, ask your health care provider. You may receive counseling support and other resources to help you quit.  Follow your caregiver's instructions regarding medicine use. There are medicines that are either safe or unsafe to take during pregnancy.  Exercise only as directed by your caregiver. Experiencing uterine cramps is a good sign to stop exercising.  Continue to eat regular, healthy meals.  Wear a good support bra for breast tenderness.  Do not use hot tubs, steam rooms, or saunas.  Wear your seat belt at all times when driving.  Avoid raw meat, uncooked cheese, cat litter boxes, and soil used by cats. These carry germs that can cause birth defects in the baby.  Take your prenatal vitamins.  Take 1500-2000 mg of calcium daily starting at the 20th week of pregnancy until you deliver your baby.  Try taking a stool softener (if your caregiver approves) if you develop constipation. Eat more high-fiber foods, such as fresh vegetables or fruit and whole grains. Drink plenty of fluids to keep your urine clear or pale yellow.  Take warm sitz baths to soothe any pain or discomfort caused by hemorrhoids. Use hemorrhoid cream if your caregiver  approves.  If you develop varicose veins, wear support hose. Elevate your feet for 15 minutes, 3-4 times a day. Limit salt in your diet.  Avoid heavy lifting, wear low heal shoes, and practice good posture.  Rest a lot with your legs elevated if you have leg cramps or low back pain.  Visit your dentist if you have not gone during your pregnancy. Use a soft toothbrush to brush your teeth and be gentle when you floss.  A sexual relationship may be continued unless your caregiver directs you otherwise.  Do not travel far distances unless it is absolutely necessary and only with the approval of your caregiver.  Take prenatal classes to understand, practice, and ask questions about the labor and delivery.  Make a trial run to the hospital.  Pack your hospital bag.  Prepare the baby's nursery.  Continue to go to all your prenatal visits as directed by your caregiver. SEEK MEDICAL CARE IF:  You are unsure if you are in labor or if your water has broken.  You have dizziness.  You have   mild pelvic cramps, pelvic pressure, or nagging pain in your abdominal area.  You have persistent nausea, vomiting, or diarrhea.  You have a bad smelling vaginal discharge.  You have pain with urination. SEEK IMMEDIATE MEDICAL CARE IF:   You have a fever.  You are leaking fluid from your vagina.  You have spotting or bleeding from your vagina.  You have severe abdominal cramping or pain.  You have rapid weight loss or gain.  You have shortness of breath with chest pain.  You notice sudden or extreme swelling of your face, hands, ankles, feet, or legs.  You have not felt your baby move in over an hour.  You have severe headaches that do not go away with medicine.  You have vision changes.   This information is not intended to replace advice given to you by your health care provider. Make sure you discuss any questions you have with your health care provider.   Document Released:  05/17/2001 Document Revised: 06/13/2014 Document Reviewed: 07/24/2012 Elsevier Interactive Patient Education 2016 [employer]  Breastfeeding Deciding to breastfeed is one of the best choices you can make for you and your baby. A change in hormones during pregnancy causes your breast tissue to grow and increases the number and size of your milk ducts. These hormones also allow proteins, sugars, and fats from your blood supply to make breast milk in your milk-producing glands. Hormones prevent breast milk from being released before your baby is born as well as prompt milk flow after birth. Once breastfeeding has begun, thoughts of your baby, as well as his or her sucking or crying, can stimulate the release of milk from your milk-producing glands.  BENEFITS OF BREASTFEEDING For Your Baby  Your first milk (colostrum) helps your baby's digestive system function better.  There are antibodies in your milk that help your baby fight off infections.  Your baby has a lower incidence of asthma, allergies, and sudden infant death syndrome.  The nutrients in breast milk are better for your baby than infant formulas and are designed uniquely for your baby's needs.  Breast milk improves your baby's brain development.  Your baby is less likely to develop other conditions, such as childhood obesity, asthma, or type 2 diabetes mellitus. For You  Breastfeeding helps to create a very special bond between you and your baby.  Breastfeeding is convenient. Breast milk is always available at the correct temperature and costs nothing.  Breastfeeding helps to burn calories and helps you lose the weight gained during pregnancy.  Breastfeeding makes your uterus contract to its prepregnancy size faster and slows bleeding (lochia) after you give birth.   Breastfeeding helps to lower your risk of developing type 2 diabetes mellitus, osteoporosis, and breast or ovarian cancer later in life. SIGNS THAT YOUR BABY IS  HUNGRY Early Signs of Hunger  Increased alertness or activity.  Stretching.  Movement of the head from side to side.  Movement of the head and opening of the mouth when the corner of the mouth or cheek is stroked (rooting).  Increased sucking sounds, smacking lips, cooing, sighing, or squeaking.  Hand-to-mouth movements.  Increased sucking of fingers or hands. Late Signs of Hunger  Fussing.  Intermittent crying. Extreme Signs of Hunger Signs of extreme hunger will require calming and consoling before your baby will be able to breastfeed successfully. Do not wait for the following signs of extreme hunger to occur before you initiate breastfeeding:  Restlessness.  A loud, strong cry.  Screaming.   BREASTFEEDING BASICS Breastfeeding Initiation  Find a comfortable place to sit or lie down, with your neck and back well supported.  Place a pillow or rolled up blanket under your baby to bring him or her to the level of your breast (if you are seated). Nursing pillows are specially designed to help support your arms and your baby while you breastfeed.  Make sure that your baby's abdomen is facing your abdomen.  Gently massage your breast. With your fingertips, massage from your chest wall toward your nipple in a circular motion. This encourages milk flow. You may need to continue this action during the feeding if your milk flows slowly.  Support your breast with 4 fingers underneath and your thumb above your nipple. Make sure your fingers are well away from your nipple and your baby's mouth.  Stroke your baby's lips gently with your finger or nipple.  When your baby's mouth is open wide enough, quickly bring your baby to your breast, placing your entire nipple and as much of the colored area around your nipple (areola) as possible into your baby's mouth.  More areola should be visible above your baby's upper lip than below the lower lip.  Your baby's tongue should be between his  or her lower gum and your breast.  Ensure that your baby's mouth is correctly positioned around your nipple (latched). Your baby's lips should create a seal on your breast and be turned out (everted).  It is common for your baby to suck about 2-3 minutes in order to start the flow of breast milk. Latching Teaching your baby how to latch on to your breast properly is very important. An improper latch can cause nipple pain and decreased milk supply for you and poor weight gain in your baby. Also, if your baby is not latched onto your nipple properly, he or she may swallow some air during feeding. This can make your baby fussy. Burping your baby when you switch breasts during the feeding can help to get rid of the air. However, teaching your baby to latch on properly is still the best way to prevent fussiness from swallowing air while breastfeeding. Signs that your baby has successfully latched on to your nipple:  Silent tugging or silent sucking, without causing you pain.  Swallowing heard between every 3-4 sucks.  Muscle movement above and in front of his or her ears while sucking. Signs that your baby has not successfully latched on to nipple:  Sucking sounds or smacking sounds from your baby while breastfeeding.  Nipple pain. If you think your baby has not latched on correctly, slip your finger into the corner of your baby's mouth to break the suction and place it between your baby's gums. Attempt breastfeeding initiation again. Signs of Successful Breastfeeding Signs from your baby:  A gradual decrease in the number of sucks or complete cessation of sucking.  Falling asleep.  Relaxation of his or her body.  Retention of a small amount of milk in his or her mouth.  Letting go of your breast by himself or herself. Signs from you:  Breasts that have increased in firmness, weight, and size 1-3 hours after feeding.  Breasts that are softer immediately after  breastfeeding.  Increased milk volume, as well as a change in milk consistency and color by the fifth day of breastfeeding.  Nipples that are not sore, cracked, or bleeding. Signs That Your Baby is Getting Enough Milk  Wetting at least 3 diapers in a 24-hour period.   The urine should be clear and pale yellow by age 5 days.  At least 3 stools in a 24-hour period by age 5 days. The stool should be soft and yellow.  At least 3 stools in a 24-hour period by age 7 days. The stool should be seedy and yellow.  No loss of weight greater than 10% of birth weight during the first 3 days of age.  Average weight gain of 4-7 ounces (113-198 g) per week after age 4 days.  Consistent daily weight gain by age 5 days, without weight loss after the age of 2 weeks. After a feeding, your baby may spit up a small amount. This is common. BREASTFEEDING FREQUENCY AND DURATION Frequent feeding will help you make more milk and can prevent sore nipples and breast engorgement. Breastfeed when you feel the need to reduce the fullness of your breasts or when your baby shows signs of hunger. This is called "breastfeeding on demand." Avoid introducing a pacifier to your baby while you are working to establish breastfeeding (the first 4-6 weeks after your baby is born). After this time you may choose to use a pacifier. Research has shown that pacifier use during the first year of a baby's life decreases the risk of sudden infant death syndrome (SIDS). Allow your baby to feed on each breast as long as he or she wants. Breastfeed until your baby is finished feeding. When your baby unlatches or falls asleep while feeding from the first breast, offer the second breast. Because newborns are often sleepy in the first few weeks of life, you may need to awaken your baby to get him or her to feed. Breastfeeding times will vary from baby to baby. However, the following rules can serve as a guide to help you ensure that your baby is  properly fed:  Newborns (babies 4 weeks of age or younger) may breastfeed every 1-3 hours.  Newborns should not go longer than 3 hours during the day or 5 hours during the night without breastfeeding.  You should breastfeed your baby a minimum of 8 times in a 24-hour period until you begin to introduce solid foods to your baby at around 6 months of age. BREAST MILK PUMPING Pumping and storing breast milk allows you to ensure that your baby is exclusively fed your breast milk, even at times when you are unable to breastfeed. This is especially important if you are going back to work while you are still breastfeeding or when you are not able to be present during feedings. Your lactation consultant can give you guidelines on how long it is safe to store breast milk. A breast pump is a machine that allows you to pump milk from your breast into a sterile bottle. The pumped breast milk can then be stored in a refrigerator or freezer. Some breast pumps are operated by hand, while others use electricity. Ask your lactation consultant which type will work best for you. Breast pumps can be purchased, but some hospitals and breastfeeding support groups lease breast pumps on a monthly basis. A lactation consultant can teach you how to hand express breast milk, if you prefer not to use a pump. CARING FOR YOUR BREASTS WHILE YOU BREASTFEED Nipples can become dry, cracked, and sore while breastfeeding. The following recommendations can help keep your breasts moisturized and healthy:  Avoid using soap on your nipples.  Wear a supportive bra. Although not required, special nursing bras and tank tops are designed to allow access to your   breasts for breastfeeding without taking off your entire bra or top. Avoid wearing underwire-style bras or extremely tight bras.  Air dry your nipples for 3-4minutes after each feeding.  Use only cotton bra pads to absorb leaked breast milk. Leaking of breast milk between feedings  is normal.  Use lanolin on your nipples after breastfeeding. Lanolin helps to maintain your skin's normal moisture barrier. If you use pure lanolin, you do not need to wash it off before feeding your baby again. Pure lanolin is not toxic to your baby. You may also hand express a few drops of breast milk and gently massage that milk into your nipples and allow the milk to air dry. In the first few weeks after giving birth, some women experience extremely full breasts (engorgement). Engorgement can make your breasts feel heavy, warm, and tender to the touch. Engorgement peaks within 3-5 days after you give birth. The following recommendations can help ease engorgement:  Completely empty your breasts while breastfeeding or pumping. You may want to start by applying warm, moist heat (in the shower or with warm water-soaked hand towels) just before feeding or pumping. This increases circulation and helps the milk flow. If your baby does not completely empty your breasts while breastfeeding, pump any extra milk after he or she is finished.  Wear a snug bra (nursing or regular) or tank top for 1-2 days to signal your body to slightly decrease milk production.  Apply ice packs to your breasts, unless this is too uncomfortable for you.  Make sure that your baby is latched on and positioned properly while breastfeeding. If engorgement persists after 48 hours of following these recommendations, contact your health care provider or a lactation consultant. OVERALL HEALTH CARE RECOMMENDATIONS WHILE BREASTFEEDING  Eat healthy foods. Alternate between meals and snacks, eating 3 of each per day. Because what you eat affects your breast milk, some of the foods may make your baby more irritable than usual. Avoid eating these foods if you are sure that they are negatively affecting your baby.  Drink milk, fruit juice, and water to satisfy your thirst (about 10 glasses a day).  Rest often, relax, and continue to take  your prenatal vitamins to prevent fatigue, stress, and anemia.  Continue breast self-awareness checks.  Avoid chewing and smoking tobacco. Chemicals from cigarettes that pass into breast milk and exposure to secondhand smoke may harm your baby.  Avoid alcohol and drug use, including marijuana. Some medicines that may be harmful to your baby can pass through breast milk. It is important to ask your health care provider before taking any medicine, including all over-the-counter and prescription medicine as well as vitamin and herbal supplements. It is possible to become pregnant while breastfeeding. If birth control is desired, ask your health care provider about options that will be safe for your baby. SEEK MEDICAL CARE IF:  You feel like you want to stop breastfeeding or have become frustrated with breastfeeding.  You have painful breasts or nipples.  Your nipples are cracked or bleeding.  Your breasts are red, tender, or warm.  You have a swollen area on either breast.  You have a fever or chills.  You have nausea or vomiting.  You have drainage other than breast milk from your nipples.  Your breasts do not become full before feedings by the fifth day after you give birth.  You feel sad and depressed.  Your baby is too sleepy to eat well.  Your baby is having trouble sleeping.     Your baby is wetting less than 3 diapers in a 24-hour period.  Your baby has less than 3 stools in a 24-hour period.  Your baby's skin or the white part of his or her eyes becomes yellow.   Your baby is not gaining weight by 5 days of age. SEEK IMMEDIATE MEDICAL CARE IF:  Your baby is overly tired (lethargic) and does not want to wake up and feed.  Your baby develops an unexplained fever.   This information is not intended to replace advice given to you by your health care provider. Make sure you discuss any questions you have with your health care provider.   Document Released: 05/23/2005  Document Revised: 02/11/2015 Document Reviewed: 11/14/2012 Elsevier Interactive Patient Education 2016 [employer]  

## 2015-07-02 NOTE — Progress Notes (Signed)
C/o sharp pains inside vaginal area. C/o rash on abdomen/ back/ arms.

## 2015-07-03 LAB — GC/CHLAMYDIA PROBE AMP (~~LOC~~) NOT AT ARMC
Chlamydia: NEGATIVE
Neisseria Gonorrhea: NEGATIVE

## 2015-07-04 LAB — CULTURE, BETA STREP (GROUP B ONLY)

## 2015-07-09 ENCOUNTER — Ambulatory Visit (INDEPENDENT_AMBULATORY_CARE_PROVIDER_SITE_OTHER): Payer: Medicaid Other | Admitting: Obstetrics & Gynecology

## 2015-07-09 ENCOUNTER — Ambulatory Visit (HOSPITAL_COMMUNITY)
Admission: RE | Admit: 2015-07-09 | Discharge: 2015-07-09 | Disposition: A | Discharge status: Home / Self Care | Payer: Medicaid Other | Source: Ambulatory Visit | Attending: Obstetrics & Gynecology | Admitting: Obstetrics & Gynecology

## 2015-07-09 VITALS — BP 140/92 | HR 85 | Temp 97.7°F | Wt 192.7 lb

## 2015-07-09 DIAGNOSIS — O99320 Drug use complicating pregnancy, unspecified trimester: Secondary | ICD-10-CM

## 2015-07-09 DIAGNOSIS — Z3A37 37 weeks gestation of pregnancy: Secondary | ICD-10-CM | POA: Diagnosis not present

## 2015-07-09 DIAGNOSIS — O133 Gestational [pregnancy-induced] hypertension without significant proteinuria, third trimester: Secondary | ICD-10-CM | POA: Insufficient documentation

## 2015-07-09 DIAGNOSIS — O3413 Maternal care for benign tumor of corpus uteri, third trimester: Secondary | ICD-10-CM | POA: Insufficient documentation

## 2015-07-09 DIAGNOSIS — F1120 Opioid dependence, uncomplicated: Secondary | ICD-10-CM

## 2015-07-09 DIAGNOSIS — O163 Unspecified maternal hypertension, third trimester: Secondary | ICD-10-CM

## 2015-07-09 DIAGNOSIS — F1110 Opioid abuse, uncomplicated: Secondary | ICD-10-CM | POA: Diagnosis not present

## 2015-07-09 DIAGNOSIS — O0973 Supervision of high risk pregnancy due to social problems, third trimester: Secondary | ICD-10-CM

## 2015-07-09 DIAGNOSIS — D259 Leiomyoma of uterus, unspecified: Secondary | ICD-10-CM | POA: Diagnosis not present

## 2015-07-09 DIAGNOSIS — O99323 Drug use complicating pregnancy, third trimester: Secondary | ICD-10-CM | POA: Diagnosis not present

## 2015-07-09 LAB — COMPREHENSIVE METABOLIC PANEL
ALK PHOS: 130 U/L — AB (ref 33–115)
ALT: 20 U/L (ref 6–29)
AST: 26 U/L (ref 10–30)
Albumin: 3 g/dL — ABNORMAL LOW (ref 3.6–5.1)
BILIRUBIN TOTAL: 0.2 mg/dL (ref 0.2–1.2)
BUN: 9 mg/dL (ref 7–25)
CALCIUM: 9.1 mg/dL (ref 8.6–10.2)
CO2: 21 mmol/L (ref 20–31)
Chloride: 102 mmol/L (ref 98–110)
Creat: 0.6 mg/dL (ref 0.50–1.10)
GLUCOSE: 93 mg/dL (ref 65–99)
Potassium: 4.4 mmol/L (ref 3.5–5.3)
Sodium: 134 mmol/L — ABNORMAL LOW (ref 135–146)
TOTAL PROTEIN: 6.3 g/dL (ref 6.1–8.1)

## 2015-07-09 LAB — CBC
HEMATOCRIT: 35.3 % — AB (ref 36.0–46.0)
HEMOGLOBIN: 12.1 g/dL (ref 12.0–15.0)
MCH: 29.2 pg (ref 26.0–34.0)
MCHC: 34.3 g/dL (ref 30.0–36.0)
MCV: 85.3 fL (ref 78.0–100.0)
MPV: 10.4 fL (ref 8.6–12.4)
Platelets: 272 10*3/uL (ref 150–400)
RBC: 4.14 MIL/uL (ref 3.87–5.11)
RDW: 13.5 % (ref 11.5–15.5)
WBC: 8.5 10*3/uL (ref 4.0–10.5)

## 2015-07-09 LAB — POCT URINALYSIS DIP (DEVICE)
BILIRUBIN URINE: NEGATIVE
Glucose, UA: NEGATIVE mg/dL
HGB URINE DIPSTICK: NEGATIVE
KETONES UR: NEGATIVE mg/dL
NITRITE: NEGATIVE
PH: 6 (ref 5.0–8.0)
Protein, ur: NEGATIVE mg/dL
SPECIFIC GRAVITY, URINE: 1.015 (ref 1.005–1.030)
Urobilinogen, UA: 0.2 mg/dL (ref 0.0–1.0)

## 2015-07-09 NOTE — Progress Notes (Signed)
MFM BPP today, will work in ASAP.

## 2015-07-09 NOTE — Progress Notes (Signed)
Subjective:no headache or visual changes  Rebecca Hancock is a 35 y.o. G1P0 at [redacted]w[redacted]d being seen today for ongoing prenatal care.  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  Patient reports rash.  Contractions: Irregular. Vag. Bleeding: None.  Movement: Present. Denies leaking of fluid.   The following portions of the patient's history were reviewed and updated as appropriate: allergies, current medications, past family history, past medical history, past social history, past surgical history and problem list. Problem list updated.  Objective:   Filed Vitals:   07/09/15 1028 07/09/15 1036  BP: 140/89 140/92  Pulse: 85   Temp: 97.7 F (36.5 C)   Weight: 192 lb 11.2 oz (87.408 kg)     Fetal Status: Fetal Heart Rate (bpm): 144 Fundal Height: 37 cm Movement: Present     General:  Alert, oriented and cooperative. Patient is in no acute distress.  Skin: Skin is warm and dry. No rash noted.   Cardiovascular: Normal heart rate noted  Respiratory: Normal respiratory effort, no problems with respiration noted  Abdomen: Soft, gravid, appropriate for gestational age. Pain/Pressure: Present     Pelvic: Vag. Bleeding: None     Cervical exam deferred        Extremities: Normal range of motion.  Edema: Mild pitting, slight indentation  Mental Status: Normal mood and affect. Normal behavior. Normal judgment and thought content.   Urinalysis: Urine Protein: Negative Urine Glucose: Negative  Assessment and Plan:  Pregnancy: G1P0 at [redacted]w[redacted]d  1. Supervision of high risk pregnancy due to social problems, antepartum, third trimester Elevated BP 2. Methadone maintenance treatment affecting pregnancy,  antepartum ([place]) Still in therapy 3. Hypertension no proteinuria, BPP and labs today and precautions given Term labor symptoms and general obstetric precautions including but not limited to vaginal bleeding, contractions, leaking of fluid and fetal movement were reviewed in detail with the patient. Please refer to After Visit Summary for other counseling recommendations.  RTC 4 days  Woodroe Mode, MD

## 2015-07-09 NOTE — Progress Notes (Signed)
Declined tdap today. Noticed increase in edema. , denies headaches.

## 2015-07-09 NOTE — Patient Instructions (Signed)

## 2015-07-10 LAB — PROTEIN / CREATININE RATIO, URINE
Creatinine, Urine: 49 mg/dL (ref 20–320)
PROTEIN CREATININE RATIO: 245 mg/g{creat} — AB (ref 21–161)
TOTAL PROTEIN, URINE: 12 mg/dL (ref 5–24)

## 2015-07-13 ENCOUNTER — Ambulatory Visit (INDEPENDENT_AMBULATORY_CARE_PROVIDER_SITE_OTHER): Payer: Medicaid Other | Admitting: *Deleted

## 2015-07-13 VITALS — BP 133/82 | HR 73 | Wt 194.8 lb

## 2015-07-13 DIAGNOSIS — O133 Gestational [pregnancy-induced] hypertension without significant proteinuria, third trimester: Secondary | ICD-10-CM | POA: Diagnosis not present

## 2015-07-13 DIAGNOSIS — F1120 Opioid dependence, uncomplicated: Secondary | ICD-10-CM | POA: Diagnosis not present

## 2015-07-13 DIAGNOSIS — O99320 Drug use complicating pregnancy, unspecified trimester: Secondary | ICD-10-CM | POA: Diagnosis not present

## 2015-07-13 DIAGNOSIS — IMO0001 Reserved for inherently not codable concepts without codable children: Secondary | ICD-10-CM

## 2015-07-13 DIAGNOSIS — R030 Elevated blood-pressure reading, without diagnosis of hypertension: Principal | ICD-10-CM

## 2015-07-14 NOTE — Progress Notes (Signed)
07/13/15 NST reviewed and reactive

## 2015-07-16 ENCOUNTER — Ambulatory Visit (INDEPENDENT_AMBULATORY_CARE_PROVIDER_SITE_OTHER): Payer: Medicaid Other | Admitting: Obstetrics and Gynecology

## 2015-07-16 VITALS — BP 124/77 | HR 81 | Temp 97.6°F | Wt 197.3 lb

## 2015-07-16 DIAGNOSIS — O0973 Supervision of high risk pregnancy due to social problems, third trimester: Secondary | ICD-10-CM | POA: Diagnosis not present

## 2015-07-16 DIAGNOSIS — Z36 Encounter for antenatal screening of mother: Secondary | ICD-10-CM | POA: Diagnosis not present

## 2015-07-16 DIAGNOSIS — F1910 Other psychoactive substance abuse, uncomplicated: Secondary | ICD-10-CM

## 2015-07-16 DIAGNOSIS — O99323 Drug use complicating pregnancy, third trimester: Secondary | ICD-10-CM

## 2015-07-16 DIAGNOSIS — F1120 Opioid dependence, uncomplicated: Secondary | ICD-10-CM

## 2015-07-16 DIAGNOSIS — O99320 Drug use complicating pregnancy, unspecified trimester: Secondary | ICD-10-CM

## 2015-07-16 LAB — POCT URINALYSIS DIP (DEVICE)
BILIRUBIN URINE: NEGATIVE
Glucose, UA: NEGATIVE mg/dL
HGB URINE DIPSTICK: NEGATIVE
KETONES UR: NEGATIVE mg/dL
LEUKOCYTES UA: NEGATIVE
Nitrite: NEGATIVE
PH: 6 (ref 5.0–8.0)
Protein, ur: NEGATIVE mg/dL
SPECIFIC GRAVITY, URINE: 1.015 (ref 1.005–1.030)
Urobilinogen, UA: 0.2 mg/dL (ref 0.0–1.0)

## 2015-07-16 NOTE — Progress Notes (Signed)
Breastfeeding tip of the week reviewed. 

## 2015-07-16 NOTE — Progress Notes (Deleted)
Subjective:  Rebecca Hancock is a 35 y.o. G1P0 at [redacted]w[redacted]d being seen today for ongoing prenatal care.  She is currently monitored for the following issues for this {Blank single:19197::"high-risk","low-risk"} pregnancy and has Polysubstance abuse; Elevated blood pressure; Depression; Anxiety; Insomnia; Supervision of high risk pregnancy due to social problems, antepartum; Methadone maintenance treatment affecting pregnancy, antepartum ([place]); and Allergic angioedema on her problem list.  Patient reports {sx:14538}.   .  .   . Denies leaking of fluid.   The following portions of the patient's history were reviewed and updated as appropriate: allergies, current medications, past family history, past medical history, past social history, past surgical history and problem list. Problem list updated.  Objective:  There were no vitals filed for this visit.  Fetal Status:           General:  Alert, oriented and cooperative. Patient is in no acute distress.  Skin: Skin is warm and dry. No rash noted.   Cardiovascular: Normal heart rate noted  Respiratory: Normal respiratory effort, no problems with respiration noted  Abdomen: Soft, gravid, appropriate for gestational age.       Pelvic:       {Blank single:19197::"Cervical exam performed","Cervical exam deferred"}        Extremities: Normal range of motion.     Mental Status: Normal mood and affect. Normal behavior. Normal judgment and thought content.   Urinalysis:      Assessment and Plan:  Pregnancy: G1P0 at [redacted]w[redacted]d  1. Supervision of high risk pregnancy due to social problems, antepartum, third trimester ***  2. Polysubstance abuse ***  3. Methadone maintenance treatment affecting pregnancy, antepartum ([facility]) ***  {Blank single:19197::"Term","Preterm"} labor symptoms and general obstetric precautions including but not limited to vaginal bleeding, contractions, leaking of fluid and fetal movement were reviewed in detail with the  patient. Please refer to After Visit Summary for other counseling recommendations.  No Follow-up on file.   Rebecca Macadam, MD

## 2015-07-16 NOTE — Progress Notes (Signed)
Addendum, late note completing documentation. Lavonna Monarch

## 2015-07-16 NOTE — Patient Instructions (Signed)

## 2015-07-16 NOTE — Addendum Note (Signed)
Addended by: Langston Reusing on: 07/16/2015 02:54 PM   Modules accepted: Orders

## 2015-07-16 NOTE — Progress Notes (Signed)
Subjective:  Rebecca Hancock is a 35 y.o. G1P0 at [redacted]w[redacted]d being seen today for ongoing prenatal care.  She is currently monitored for the following issues for this high-risk pregnancy and has Polysubstance abuse; Elevated blood pressure; Depression; Anxiety; Insomnia; Supervision of high risk pregnancy due to social problems, antepartum; Methadone maintenance treatment affecting pregnancy, antepartum ([place]); and Allergic angioedema on her problem list.  Patient reports no complaints.  Contractions: Irregular. Vag. Bleeding: None.  Movement: Present. Denies leaking of fluid.   The following portions of the patient's history were reviewed and updated as appropriate: allergies, current medications, past family history, past medical history, past social history, past surgical history and problem list. Problem list updated.  Objective:   Filed Vitals:   07/16/15 1208  BP: 124/77  Pulse: 81  Temp: 97.6 F (36.4 C)  Weight: 197 lb 4.8 oz (89.495 kg)    Fetal Status: Fetal Heart Rate (bpm): 123 Fundal Height: 38 cm Movement: Present  Presentation: Vertex  General:  Alert, oriented and cooperative. Patient is in no acute distress.  Skin: Skin is warm and dry. No rash noted.   Cardiovascular: Normal heart rate noted  Respiratory: Normal respiratory effort, no problems with respiration noted  Abdomen: Soft, gravid, appropriate for gestational age. Pain/Pressure: Present     Pelvic: Vag. Bleeding: None     Cervical exam performed Dilation: Fingertip Effacement (%): 0 Station: -3  Extremities: Normal range of motion.  Edema: Mild pitting, slight indentation  Mental Status: Normal mood and affect. Normal behavior. Normal judgment and thought content.   Urinalysis: Urine Protein: Negative Urine Glucose: Negative  Assessment and Plan:  Pregnancy: G1P0 at [redacted]w[redacted]d  1. Supervision of high risk pregnancy due to social problems, antepartum, third trimester Patient is doing well Patient with elevated BP at  37 weeks. She has been normotensive and asymptomatic since. Continue monitoring. NST today reactive  2. Polysubstance abuse Continue current methadone dosing  3. Methadone maintenance treatment affecting pregnancy, antepartum ([place])   Term labor symptoms and general obstetric precautions including but not limited to vaginal bleeding, contractions, leaking of fluid and fetal movement were reviewed in detail with the patient. Please refer to After Visit Summary for other counseling recommendations.  No Follow-up on file.   Mora Bellman, MD

## 2015-07-20 ENCOUNTER — Ambulatory Visit (INDEPENDENT_AMBULATORY_CARE_PROVIDER_SITE_OTHER): Payer: Medicaid Other | Admitting: *Deleted

## 2015-07-20 VITALS — BP 139/85 | HR 83

## 2015-07-20 DIAGNOSIS — O99320 Drug use complicating pregnancy, unspecified trimester: Principal | ICD-10-CM

## 2015-07-20 DIAGNOSIS — O99323 Drug use complicating pregnancy, third trimester: Secondary | ICD-10-CM

## 2015-07-20 DIAGNOSIS — F1120 Opioid dependence, uncomplicated: Secondary | ICD-10-CM

## 2015-07-20 NOTE — Progress Notes (Signed)
Pt denies H/A or visual disturbances.  

## 2015-07-20 NOTE — Progress Notes (Signed)
NST reactive.

## 2015-07-23 ENCOUNTER — Encounter: Payer: Medicaid Other | Admitting: Obstetrics and Gynecology

## 2015-07-23 ENCOUNTER — Ambulatory Visit (INDEPENDENT_AMBULATORY_CARE_PROVIDER_SITE_OTHER): Payer: Medicaid Other | Admitting: Obstetrics & Gynecology

## 2015-07-23 ENCOUNTER — Telehealth (HOSPITAL_COMMUNITY): Payer: Self-pay | Admitting: *Deleted

## 2015-07-23 VITALS — BP 140/89 | HR 84 | Wt 196.5 lb

## 2015-07-23 DIAGNOSIS — O99320 Drug use complicating pregnancy, unspecified trimester: Secondary | ICD-10-CM | POA: Diagnosis not present

## 2015-07-23 DIAGNOSIS — O133 Gestational [pregnancy-induced] hypertension without significant proteinuria, third trimester: Secondary | ICD-10-CM

## 2015-07-23 DIAGNOSIS — F1120 Opioid dependence, uncomplicated: Secondary | ICD-10-CM

## 2015-07-23 DIAGNOSIS — Z36 Encounter for antenatal screening of mother: Secondary | ICD-10-CM

## 2015-07-23 DIAGNOSIS — O0973 Supervision of high risk pregnancy due to social problems, third trimester: Secondary | ICD-10-CM

## 2015-07-23 LAB — POCT URINALYSIS DIP (DEVICE)
Bilirubin Urine: NEGATIVE
GLUCOSE, UA: NEGATIVE mg/dL
HGB URINE DIPSTICK: NEGATIVE
Ketones, ur: NEGATIVE mg/dL
Leukocytes, UA: NEGATIVE
Nitrite: NEGATIVE
PROTEIN: NEGATIVE mg/dL
SPECIFIC GRAVITY, URINE: 1.02 (ref 1.005–1.030)
UROBILINOGEN UA: 0.2 mg/dL (ref 0.0–1.0)
pH: 6 (ref 5.0–8.0)

## 2015-07-23 NOTE — Telephone Encounter (Signed)
Preadmission screen  

## 2015-07-23 NOTE — Progress Notes (Signed)
Subjective:  Rebecca Hancock is a 35 y.o. G1P0 at [redacted]w[redacted]d being seen today for ongoing prenatal care.  She is currently monitored for the following issues for this high-risk pregnancy and has Polysubstance abuse; Gestational hypertension in third trimester; Depression; Anxiety; Insomnia; Supervision of high risk pregnancy due to social problems, antepartum; Methadone maintenance treatment affecting pregnancy, antepartum ([place]); and Allergic angioedema on her problem list.  Patient reports decreased FM and is concerned.  Contractions: Irregular. Vag. Bleeding: None.  Movement: (!) Decreased. Denies leaking of fluid.  No headaches, visual symptoms, abdominal pain.  The following portions of the patient's history were reviewed and updated as appropriate: allergies, current medications, past family history, past medical history, past social history, past surgical history and problem list. Problem list updated.  Objective:   Filed Vitals:   07/23/15 0815  BP: 140/89  Pulse: 84  Weight: 196 lb 8 oz (89.132 kg)    Fetal Status: Fetal Heart Rate (bpm): NST Fundal Height: 40 cm Movement: (!) Decreased  Presentation: Vertex  General:  Alert, oriented and cooperative. Patient is in no acute distress.  Skin: Skin is warm and dry. No rash noted.   Cardiovascular: Normal heart rate noted  Respiratory: Normal respiratory effort, no problems with respiration noted  Abdomen: Soft, gravid, appropriate for gestational age. Pain/Pressure: Present     Pelvic: Vag. Bleeding: None    Cervical exam performed Dilation: Fingertip Effacement (%): 50 Station: -2  Extremities: Normal range of motion.  Edema: Trace  Mental Status: Normal mood and affect. Normal behavior. Normal judgment and thought content.   Urinalysis: Urine Protein: Negative Urine Glucose: Negative NST performed today was reviewed and was found to be reactive.  AFI normal at 15.9 cm.  Continue recommended antenatal testing and prenatal  care.  Assessment and Plan:  Pregnancy: G1P0 at [redacted]w[redacted]d  1. Gestational hypertension in third trimester SBP > 140 at 30, 36, 37 and 39 weeks. Negative labs on 07/08/14.  No symptoms. Offered IOL on 07/23/15, patient deferred to 07/25/15. Normal testing today.  Strict FM and preeclampsia precautions reviewed.  2. Methadone maintenance treatment affecting pregnancy, antepartum ([place]) Continue Methadone treatment.  3. Supervision of high risk pregnancy due to social problems, antepartum, third trimester Preterm labor symptoms and general obstetric precautions including but not limited to vaginal bleeding, contractions, leaking of fluid and fetal movement were reviewed in detail with the patient. Please refer to After Visit Summary for other counseling recommendations.  Return in about 6 weeks (around 09/03/2015) for Postpartum check.   Osborne Oman, MD

## 2015-07-23 NOTE — Progress Notes (Signed)
Pt reports decreased FM since yesterday.  

## 2015-07-23 NOTE — Patient Instructions (Signed)
Return to clinic for any obstetric concerns or go to [facility] for evaluation  

## 2015-07-24 ENCOUNTER — Other Ambulatory Visit: Payer: Self-pay | Admitting: Advanced Practice Midwife

## 2015-07-25 ENCOUNTER — Inpatient Hospital Stay (HOSPITAL_COMMUNITY): Payer: Medicaid Other | Admitting: Anesthesiology

## 2015-07-25 ENCOUNTER — Inpatient Hospital Stay (HOSPITAL_COMMUNITY)
Admission: RE | Admit: 2015-07-25 | Discharge: 2015-07-27 | DRG: 775 | Disposition: A | Discharge status: Home / Self Care | Payer: Medicaid Other | Source: Ambulatory Visit | Attending: Obstetrics and Gynecology | Admitting: Obstetrics and Gynecology

## 2015-07-25 ENCOUNTER — Encounter (HOSPITAL_COMMUNITY): Payer: Self-pay

## 2015-07-25 VITALS — BP 137/92 | HR 70 | Temp 98.1°F | Resp 16 | Ht 62.0 in | Wt 196.5 lb

## 2015-07-25 DIAGNOSIS — F1120 Opioid dependence, uncomplicated: Secondary | ICD-10-CM | POA: Diagnosis present

## 2015-07-25 DIAGNOSIS — F329 Major depressive disorder, single episode, unspecified: Secondary | ICD-10-CM | POA: Diagnosis present

## 2015-07-25 DIAGNOSIS — O99324 Drug use complicating childbirth: Secondary | ICD-10-CM | POA: Diagnosis present

## 2015-07-25 DIAGNOSIS — O133 Gestational [pregnancy-induced] hypertension without significant proteinuria, third trimester: Secondary | ICD-10-CM | POA: Diagnosis present

## 2015-07-25 DIAGNOSIS — O99344 Other mental disorders complicating childbirth: Secondary | ICD-10-CM | POA: Diagnosis present

## 2015-07-25 DIAGNOSIS — O99320 Drug use complicating pregnancy, unspecified trimester: Secondary | ICD-10-CM

## 2015-07-25 DIAGNOSIS — O99334 Smoking (tobacco) complicating childbirth: Secondary | ICD-10-CM | POA: Diagnosis present

## 2015-07-25 DIAGNOSIS — F17210 Nicotine dependence, cigarettes, uncomplicated: Secondary | ICD-10-CM | POA: Diagnosis present

## 2015-07-25 DIAGNOSIS — O1414 Severe pre-eclampsia complicating childbirth: Principal | ICD-10-CM | POA: Diagnosis present

## 2015-07-25 DIAGNOSIS — Z3A39 39 weeks gestation of pregnancy: Secondary | ICD-10-CM | POA: Diagnosis not present

## 2015-07-25 DIAGNOSIS — O1410 Severe pre-eclampsia, unspecified trimester: Secondary | ICD-10-CM

## 2015-07-25 DIAGNOSIS — F32A Depression, unspecified: Secondary | ICD-10-CM | POA: Diagnosis present

## 2015-07-25 DIAGNOSIS — F1911 Other psychoactive substance abuse, in remission: Secondary | ICD-10-CM | POA: Diagnosis present

## 2015-07-25 DIAGNOSIS — F1110 Opioid abuse, uncomplicated: Secondary | ICD-10-CM | POA: Diagnosis present

## 2015-07-25 DIAGNOSIS — O134 Gestational [pregnancy-induced] hypertension without significant proteinuria, complicating childbirth: Secondary | ICD-10-CM | POA: Diagnosis present

## 2015-07-25 LAB — CBC
HEMATOCRIT: 35.7 % — AB (ref 36.0–46.0)
HEMATOCRIT: 35 % — AB (ref 36.0–46.0)
HEMOGLOBIN: 11.9 g/dL — AB (ref 12.0–15.0)
Hemoglobin: 12.2 g/dL (ref 12.0–15.0)
MCH: 29.2 pg (ref 26.0–34.0)
MCH: 29.2 pg (ref 26.0–34.0)
MCHC: 34.2 g/dL (ref 30.0–36.0)
MCHC: 34 g/dL (ref 30.0–36.0)
MCV: 85.4 fL (ref 78.0–100.0)
MCV: 85.8 fL (ref 78.0–100.0)
Platelets: 256 10*3/uL (ref 150–400)
Platelets: 229 10*3/uL (ref 150–400)
RBC: 4.18 MIL/uL (ref 3.87–5.11)
RBC: 4.08 MIL/uL (ref 3.87–5.11)
RDW: 13.5 % (ref 11.5–15.5)
RDW: 13.6 % (ref 11.5–15.5)
WBC: 9.1 10*3/uL (ref 4.0–10.5)
WBC: 13 10*3/uL — AB (ref 4.0–10.5)

## 2015-07-25 LAB — COMPREHENSIVE METABOLIC PANEL
ALBUMIN: 2.6 g/dL — AB (ref 3.5–5.0)
ALBUMIN: 2.6 g/dL — AB (ref 3.5–5.0)
ALK PHOS: 129 U/L — AB (ref 38–126)
ALK PHOS: 114 U/L (ref 38–126)
ALT: 15 U/L (ref 14–54)
ALT: 15 U/L (ref 14–54)
ANION GAP: 7 (ref 5–15)
AST: 26 U/L (ref 15–41)
AST: 27 U/L (ref 15–41)
Anion gap: 8 (ref 5–15)
BILIRUBIN TOTAL: 0.2 mg/dL — AB (ref 0.3–1.2)
BILIRUBIN TOTAL: 0.2 mg/dL — AB (ref 0.3–1.2)
BUN: 14 mg/dL (ref 6–20)
BUN: 11 mg/dL (ref 6–20)
CALCIUM: 9.1 mg/dL (ref 8.9–10.3)
CO2: 21 mmol/L — AB (ref 22–32)
CO2: 24 mmol/L (ref 22–32)
CREATININE: 0.79 mg/dL (ref 0.44–1.00)
CREATININE: 0.79 mg/dL (ref 0.44–1.00)
Calcium: 9 mg/dL (ref 8.9–10.3)
Chloride: 104 mmol/L (ref 101–111)
Chloride: 105 mmol/L (ref 101–111)
GFR calc Af Amer: >60 mL/min (ref >60)
GFR calc Af Amer: >60 mL/min (ref >60)
GFR calc non Af Amer: >60 mL/min (ref >60)
GFR calc non Af Amer: >60 mL/min (ref >60)
GLUCOSE: 77 mg/dL (ref 65–99)
GLUCOSE: 88 mg/dL (ref 65–99)
POTASSIUM: 4.1 mmol/L (ref 3.5–5.1)
Potassium: 4.3 mmol/L (ref 3.5–5.1)
SODIUM: 132 mmol/L — AB (ref 135–145)
Sodium: 137 mmol/L (ref 135–145)
TOTAL PROTEIN: 6.4 g/dL — AB (ref 6.5–8.1)
Total Protein: 6.6 g/dL (ref 6.5–8.1)

## 2015-07-25 LAB — RAPID URINE DRUG SCREEN, HOSP PERFORMED
Amphetamines: NOT DETECTED
BARBITURATES: NOT DETECTED
Benzodiazepines: NOT DETECTED
COCAINE: NOT DETECTED
OPIATES: NOT DETECTED
Tetrahydrocannabinol: NOT DETECTED

## 2015-07-25 LAB — PROTEIN / CREATININE RATIO, URINE
Creatinine, Urine: 65 mg/dL
Creatinine, Urine: 35 mg/dL
PROTEIN CREATININE RATIO: 0.2 mg/mg{creat} — AB (ref 0.00–0.15)
Protein Creatinine Ratio: 0.31 mg/mg{Cre} — ABNORMAL HIGH (ref 0.00–0.15)
TOTAL PROTEIN, URINE: 13 mg/dL
Total Protein, Urine: 11 mg/dL

## 2015-07-25 LAB — TYPE AND SCREEN
ABO/RH(D): O POS
ANTIBODY SCREEN: NEGATIVE

## 2015-07-25 LAB — MRSA PCR SCREENING: MRSA by PCR: NEGATIVE

## 2015-07-25 LAB — ABO/RH: ABO/RH(D): O POS

## 2015-07-25 MED ORDER — FENTANYL 2.5 MCG/ML BUPIVACAINE 1/10 % EPIDURAL INFUSION (WH - ANES)
14.0000 mL/h | INTRAMUSCULAR | Status: DC | PRN
  Administered 2015-07-25 (×2): 14 mL/h via EPIDURAL
  Filled 2015-07-25 (×2): qty 125

## 2015-07-25 MED ORDER — OXYTOCIN BOLUS FROM INFUSION
500.0000 mL | INTRAVENOUS | Status: DC
  Administered 2015-07-25: 500 mL via INTRAVENOUS

## 2015-07-25 MED ORDER — MAGNESIUM SULFATE 50 % IJ SOLN
2.0000 g/h | INTRAMUSCULAR | Status: DC
  Administered 2015-07-25: 2 g/h via INTRAVENOUS
  Filled 2015-07-25: qty 80

## 2015-07-25 MED ORDER — OXYCODONE-ACETAMINOPHEN 5-325 MG PO TABS: 1.0000 | ORAL_TABLET | ORAL | Status: DC | PRN

## 2015-07-25 MED ORDER — LACTATED RINGERS IV SOLN
INTRAVENOUS | Status: DC
  Administered 2015-07-25 – 2015-07-26 (×3): via INTRAVENOUS

## 2015-07-25 MED ORDER — TERBUTALINE SULFATE 1 MG/ML IJ SOLN: 0.2500 mg | Freq: Once | INTRAMUSCULAR | Status: DC | PRN

## 2015-07-25 MED ORDER — LIDOCAINE HCL (PF) 1 % IJ SOLN
30.0000 mL | INTRAMUSCULAR | Status: DC | PRN
Start: 2015-07-25 — End: 2015-07-27
  Administered 2015-07-25: 30 mL via SUBCUTANEOUS
  Filled 2015-07-25: qty 30

## 2015-07-25 MED ORDER — LACTULOSE 10 GM/15ML PO SOLN
20.0000 g | Freq: Two times a day (BID) | ORAL | Status: DC
  Filled 2015-07-25 (×3): qty 30

## 2015-07-25 MED ORDER — CITRIC ACID-SODIUM CITRATE 334-500 MG/5ML PO SOLN: 30.0000 mL | ORAL | Status: DC | PRN

## 2015-07-25 MED ORDER — OXYTOCIN 10 UNIT/ML IJ SOLN: 2.5000 [IU]/h | INTRAVENOUS | Status: DC

## 2015-07-25 MED ORDER — LACTATED RINGERS IV SOLN
INTRAVENOUS | Status: DC
  Administered 2015-07-25: 300 mL via INTRAUTERINE

## 2015-07-25 MED ORDER — HYDRALAZINE HCL 20 MG/ML IJ SOLN: 10.0000 mg | Freq: Once | INTRAMUSCULAR | Status: DC | PRN

## 2015-07-25 MED ORDER — PHENYLEPHRINE 40 MCG/ML (10ML) SYRINGE FOR IV PUSH (FOR BLOOD PRESSURE SUPPORT)
80.0000 ug | PREFILLED_SYRINGE | INTRAVENOUS | Status: DC | PRN
  Filled 2015-07-25 (×2): qty 20
  Filled 2015-07-25: qty 2

## 2015-07-25 MED ORDER — PHENYLEPHRINE 40 MCG/ML (10ML) SYRINGE FOR IV PUSH (FOR BLOOD PRESSURE SUPPORT)
80.0000 ug | PREFILLED_SYRINGE | INTRAVENOUS | Status: DC | PRN
  Filled 2015-07-25: qty 2

## 2015-07-25 MED ORDER — SODIUM BICARBONATE 8.4 % IV SOLN
INTRAVENOUS | Status: DC | PRN
  Administered 2015-07-25: 3 mL via EPIDURAL

## 2015-07-25 MED ORDER — FENTANYL 2.5 MCG/ML BUPIVACAINE 1/10 % EPIDURAL INFUSION (WH - ANES): 14.0000 mL/h | INTRAMUSCULAR | Status: DC | PRN

## 2015-07-25 MED ORDER — LABETALOL HCL 5 MG/ML IV SOLN
20.0000 mg | INTRAVENOUS | Status: DC | PRN
  Administered 2015-07-25: 20 mg via INTRAVENOUS
  Filled 2015-07-25: qty 4

## 2015-07-25 MED ORDER — EPHEDRINE 5 MG/ML INJ
10.0000 mg | INTRAVENOUS | Status: DC | PRN
  Filled 2015-07-25: qty 2

## 2015-07-25 MED ORDER — SODIUM CHLORIDE 0.9 % IV SOLN
INTRAVENOUS | Status: DC | PRN
  Administered 2015-07-25: 4 mL via EPIDURAL

## 2015-07-25 MED ORDER — ONDANSETRON HCL 4 MG/2ML IJ SOLN: 4.0000 mg | Freq: Four times a day (QID) | INTRAMUSCULAR | Status: DC | PRN

## 2015-07-25 MED ORDER — MISOPROSTOL 25 MCG QUARTER TABLET
25.0000 ug | ORAL_TABLET | ORAL | Status: DC
  Administered 2015-07-25: 25 ug via VAGINAL
  Filled 2015-07-25: qty 0.25
  Filled 2015-07-25 (×3): qty 1

## 2015-07-25 MED ORDER — LIDOCAINE HCL (PF) 1 % IJ SOLN
INTRAMUSCULAR | Status: DC | PRN
  Administered 2015-07-25: 4 mL

## 2015-07-25 MED ORDER — LACTATED RINGERS IV SOLN
500.0000 mL | INTRAVENOUS | Status: DC | PRN
  Administered 2015-07-25: 500 mL via INTRAVENOUS

## 2015-07-25 MED ORDER — ZOLPIDEM TARTRATE 5 MG PO TABS: 5.0000 mg | ORAL_TABLET | Freq: Every evening | ORAL | Status: DC | PRN

## 2015-07-25 MED ORDER — LABETALOL HCL 5 MG/ML IV SOLN
20.0000 mg | Freq: Once | INTRAVENOUS | Status: AC
  Administered 2015-07-25: 20 mg via INTRAVENOUS

## 2015-07-25 MED ORDER — FENTANYL CITRATE (PF) 100 MCG/2ML IJ SOLN: 100.0000 ug | INTRAMUSCULAR | Status: DC | PRN

## 2015-07-25 MED ORDER — OXYCODONE-ACETAMINOPHEN 5-325 MG PO TABS
2.0000 | ORAL_TABLET | ORAL | Status: DC | PRN
  Administered 2015-07-26: 2 via ORAL
  Filled 2015-07-25: qty 2

## 2015-07-25 MED ORDER — DIPHENHYDRAMINE HCL 50 MG/ML IJ SOLN: 12.5000 mg | INTRAMUSCULAR | Status: DC | PRN

## 2015-07-25 MED ORDER — ACETAMINOPHEN 325 MG PO TABS: 650.0000 mg | ORAL_TABLET | ORAL | Status: DC | PRN

## 2015-07-25 MED ORDER — MAGNESIUM SULFATE BOLUS VIA INFUSION
6.0000 g | Freq: Once | INTRAVENOUS | Status: AC
  Administered 2015-07-25: 6 g via INTRAVENOUS
  Filled 2015-07-25: qty 500

## 2015-07-25 MED ORDER — LACTATED RINGERS IV SOLN: 500.0000 mL | Freq: Once | INTRAVENOUS | Status: DC

## 2015-07-25 MED ORDER — METHADONE HCL 10 MG/ML PO CONC
94.0000 mg | Freq: Every day | ORAL | Status: DC
  Filled 2015-07-25: qty 9.4

## 2015-07-25 MED ORDER — OXYTOCIN 10 UNIT/ML IJ SOLN
1.0000 m[IU]/min | INTRAVENOUS | Status: DC
  Administered 2015-07-25: 2 m[IU]/min via INTRAVENOUS
  Filled 2015-07-25: qty 4

## 2015-07-25 NOTE — Anesthesia Procedure Notes (Signed)
Epidural Patient location during procedure: OB  Staffing Anesthesiologist: Nolon Nations Performed by: anesthesiologist   Preanesthetic Checklist Completed: patient identified, pre-op evaluation, timeout performed, IV checked, risks and benefits discussed and monitors and equipment checked  Epidural Patient position: sitting Prep: site prepped and draped and DuraPrep Patient monitoring: heart rate Approach: midline Location: L2-L3 Injection technique: LOR air and LOR saline  Needle:  Needle type: Tuohy  Needle gauge: 17 G Needle length: 9 cm Needle insertion depth: 6 cm Catheter type: closed end flexible Catheter size: 19 Gauge Catheter at skin depth: 12 cm Test dose: negative  Assessment Sensory level: T8 Events: blood not aspirated, injection not painful, no injection resistance, negative IV test and no paresthesia  Additional Notes Reason for block:procedure for pain

## 2015-07-25 NOTE — H&P (Signed)
Rebecca Hancock is a 35 y.o. female G1P0 with IUP at [redacted]w[redacted]d presenting for IOL due to gestational HTN (patient was offered IOL on 2/16 however declined).  She notes very sporadic irregular contractions. Good fetal movement. No vaginal bleeding. No LOF. She is on methadone, notes she fell off the wagon and had some alcohol to drink approximately 6 months ago. She also uses E-cigarettes during this pregnancy.   [facility] 8.3 [facility]ks  Prenatal History/Complications:  SBP > [id number] at 30, 36, 37 and 39 weeks. Negative labs on 07/08/14. No symptoms.  Past Medical History: Past Medical History  Diagnosis Date  . Depression   . Anxiety   . Insomnia   . Cholelithiasis 04/2012  . Depression 04/27/2012  . Anxiety 04/27/2012  . Insomnia 04/27/2012  . Insomnia   . Drug abuse     Heroin and opiates    Past Surgical History: Past Surgical History  Procedure Laterality Date  . Cholecystectomy  04/26/2012    Procedure: LAPAROSCOPIC CHOLECYSTECTOMY;  Surgeon: Gwenyth Ober, MD;  Location: [place];  Service: General;  Laterality: N/A;    Obstetrical History: OB History    Gravida Para Term Preterm AB TAB SAB Ectopic Multiple Living   1               Social History: Social History   Social History  . Marital Status: Single    Spouse Name: N/A  . Number of Children: N/A  . Years of Education: N/A   Social History Main Topics  . Smoking status: Current Every Day Smoker -- 0.50 packs/day for 10 years    Types: Cigarettes, E-cigarettes  . Smokeless tobacco: Never Used  . Alcohol Use: No     Comment: occasional  . 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 Sexual Activity: Yes   Other Topics Concern  . None   Social History Narrative    Family History: Family History  Problem Relation Age of Onset  . Hepatitis B Father   . Cancer Father     prostate    Allergies: No Known Allergies  Prescriptions prior to admission  Medication Sig  Dispense Refill Last Dose  . docusate sodium (COLACE) 100 MG capsule Take 100 mg by mouth daily as needed for mild constipation. Reported on 05/26/2015   07/24/2015 at Unknown time  . Lactulose 20 GM/30ML SOLN Take 30 mLs (20 g total) by mouth 2 (two) times daily as needed (constipation). 946 mL 2 07/24/2015 at Unknown time  . methadone (DOLOPHINE) 10 MG/ML solution Take 94 mg by mouth daily.    07/25/2015 at Unknown time  . Prenatal Multivit-Min-Fe-FA (PRENATAL VITAMINS) 0.8 MG tablet Take 1 tablet by mouth daily. 30 tablet 12 07/24/2015 at Unknown time  . acetaminophen (TYLENOL) 325 MG tablet Take 2 tablets (650 mg total) by mouth every 6 (six) hours as needed. (Patient taking differently: Take 650 mg by mouth every 6 (six) hours as needed for mild pain. )   prn  . diphenhydrAMINE (BENADRYL) 25 mg capsule Take 1 capsule (25 mg total) by mouth every 6 (six) hours as needed. 30 capsule 0 prn  . methylPREDNISolone (MEDROL DOSEPAK) 4 MG TBPK tablet Take 5, then 4, then 3, then 2, then 1 (Patient not taking: Reported on 07/09/2015) 15 tablet 0 Completed Course at Unknown time     Review of Systems   Constitutional: no fevers, chills, fatigue,  chest pain, SOB  Blood pressure 136/88, pulse 86, temperature 98 F (36.7 C), temperature source Oral, resp. rate 18, height 5\' 2"  (1.575 m), weight 196 lb 8 oz (89.132 kg), last menstrual period 10/16/2014. General appearance: alert and cooperative Lungs: clear to auscultation bilaterally Heart: regular rate and rhythm Abdomen: soft, non-tender; bowel sounds normal Extremities: Homans sign is negative, no sign of DVT Presentation: cephalic Fetal monitoring 130/mod/+a/-d Uterine activityNone Dilation: Fingertip Effacement (%): 40 Station: -3 Exam by:: Rebecca Friis, MD   Prenatal labs: ABO, Rh: O/POS/-- (08/11 1724) Antibody: NEG (08/11 1724) Rubella: !Error! RPR: NON REAC (12/01 1210)  HBsAg: NEGATIVE (08/11 1724)  HIV: NONREACTIVE (12/01 1210)  GBS:  Negative (01/26 0000)  1 hr Glucola 91 Genetic screening  normal Anatomy US low lying placenta    Prenatal Transfer Tool  Maternal Diabetes: No Genetic Screening: Normal Maternal Ultrasounds/Referrals: Normal Fetal Ultrasounds or other Referrals:  None Maternal Substance Abuse:  Yes:  Type: Methadone Significant Maternal Medications:  Nonemethadone Significant Maternal Lab Results: Lab values include: Group B Strep negative     Results for orders placed or performed during the hospital encounter of 07/25/15 (from the past 24 hour(s))  CBC   Collection Time: 07/25/15  9:00 AM  Result Value Ref Range   WBC 9.1 4.0 - 10.5 K/uL   RBC 4.18 3.87 - 5.11 MIL/uL   Hemoglobin 12.2 12.0 - 15.0 g/dL   HCT 35.7 (L) 36.0 - 46.0 %   MCV 85.4 78.0 - 100.0 fL   MCH 29.2 26.0 - 34.0 pg   MCHC 34.2 30.0 - 36.0 g/dL   RDW 13.5 11.5 - 15.5 %   Platelets 256 150 - 400 K/uL    Assessment: Rebecca Hancock is a 35 y.o. G1P0 at [redacted]w[redacted]d by LMP presenting for IOL 2/2 ghtn.  #Labor: ripening w/ foley and cytotec #Pain: epidural #FWB: Cat 1 #ID:  GBS neg #MOF: breast  #MOC: likely mirena #Circ:  N/a #Methadone maintenance: continue methadone 94mg  daily  #GHTN: here initial bp wnl, no meds, will check baseline preE labs   Desma Maxim 07/25/2015, 9:45 AM

## 2015-07-25 NOTE — Anesthesia Preprocedure Evaluation (Signed)
Anesthesia Evaluation  Patient identified by MRN, date of birth, ID band Patient awake    Reviewed: Allergy & Precautions, H&P , NPO status   Airway Mallampati: II       Dental   Pulmonary neg pulmonary ROS, Current Smoker,    breath sounds clear to auscultation       Cardiovascular hypertension, Pt. on medications  Rhythm:Regular Rate:Normal     Neuro/Psych PSYCHIATRIC DISORDERS Anxiety Depression    GI/Hepatic negative GI ROS, Histo;ry of gallstones.   Endo/Other    Renal/GU      Musculoskeletal   Abdominal   Peds  Hematology   Anesthesia Other Findings   Reproductive/Obstetrics                             Anesthesia Physical  Anesthesia Plan  ASA: III  Anesthesia Plan: Epidural   Post-op Pain Management:    Induction:   Airway Management Planned:   Additional Equipment:   Intra-op Plan:   Post-operative Plan:   Informed Consent: I have reviewed the patients History and Physical, chart, labs and discussed the procedure including the risks, benefits and alternatives for the proposed anesthesia with the patient or authorized representative who has indicated his/her understanding and acceptance.     Plan Discussed with:   Anesthesia Plan Comments:         Anesthesia Quick Evaluation

## 2015-07-25 NOTE — Progress Notes (Signed)
LABOR PROGRESS NOTE  Rebecca Hancock is a 35 y.o. G1P0 at [redacted]w[redacted]d  admitted for ghtn  Subjective: No ha, vision change, ruq pain, or sob.  Objective: BP 163/132 mmHg  Pulse 84  Temp(Src) 97.8 F (36.6 C) (Oral)  Resp 18  Ht 5\' 2"  (1.575 m)  Wt 196 lb 8 oz (89.132 kg)  BMI 35.93 kg/m2  LMP 10/16/2014 (Approximate) or  Filed Vitals:   07/25/15 1646 07/25/15 1700 07/25/15 1716 07/25/15 1730  BP: 149/89 164/90 165/107 163/132  Pulse: 73 75 81 84  Temp:  97.8 F (36.6 C)    TempSrc:  Oral    Resp: 18 18 18 18   Height:      Weight:        135/mod/+a/intermittent variable  Dilation: 9 Effacement (%): 80 Station: -1 Presentation: Vertex Exam by:: Rande Brunt, RN  Labs: Lab Results  Component Value Date   WBC 9.1 07/25/2015   HGB 12.2 07/25/2015   HCT 35.7* 07/25/2015   MCV 85.4 07/25/2015   PLT 256 07/25/2015    Patient Active Problem List   Diagnosis Date Noted  . Gestational hypertension w/o significant proteinuria in 3rd trimester 07/25/2015  . 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 Supervision of high risk pregnancy due to social problems, antepartum 12/18/2014  . Methadone maintenance treatment affecting pregnancy, antepartum ([place]) 12/18/2014  . Depression 04/27/2012  . Anxiety 04/27/2012  . Insomnia 04/27/2012  . Polysubstance abuse 04/25/2012  . Gestational hypertension in third trimester 04/25/2012    Assessment / Plan: 35 y.o. G1P0 at [redacted]w[redacted]d here for iol 2/2 ghtn  Labor: active phase progressing w/ pitocin s/p srom. Fetal Wellbeing:  Cat 2, will start amnioinfusion if variables become recurrent Pain Control:  S/p epidural Anticipated MOD:  Vaginal GHTN: now with persistent severe-range BPs including after labetalol 20 mg IV. Will start labetalol IV protocol and magnesium sulfate as well. Repeating hellp labs, which were negative earlier today.  Desma Maxim, MD 07/25/2015, 5:48 PM

## 2015-07-25 NOTE — Progress Notes (Signed)
   Rebecca Hancock is a 35 y.o. G1P0 at [redacted]w[redacted]d  admitted for induction of labor due to Hypertension and now meeting criteria for pre-eclampsia.  Subjective: Feeling better. No headache, vision change, or SOB.   Objective: Filed Vitals:   07/25/15 1959 07/25/15 2001 07/25/15 2004 07/25/15 2009  BP:  138/82    Pulse: 85 83 82 101  Temp:      TempSrc:      Resp:  18    Height:      Weight:      SpO2: 97%  96% 99%      FHT:  FHR: 135 bpm, variability: moderate,  accelerations:  Present,  decelerations:  Present occasional early decels UC:   irregular, every 2-5 minutes SVE:   Dilation: Lip/rim Effacement (%): 90 Station: +2 Exam by:: newton   Labs: Lab Results  Component Value Date   WBC 13.0* 07/25/2015   HGB 11.9* 07/25/2015   HCT 35.0* 07/25/2015   MCV 85.8 07/25/2015   PLT 229 07/25/2015    Assessment / Plan: G1P0 at [redacted]w[redacted]d here for IOL due to gHTN now meeting criteria for pre-eclampsia  Labor: Progressing slowly. Contractions inadequate per IUPC. Will start pitocin back at 5.  Fetal Wellbeing:  Category I Pain Control:  Epidural Anticipated MOD:  NSVD  Pre-eclampsia: U P:C elevated to 0.31. On labetalol protocol. Receiving magnesium.   Rebecca Hancock 07/25/2015, 8:47 PM

## 2015-07-25 NOTE — Progress Notes (Signed)
LABOR PROGRESS NOTE  Rebecca Hancock is a 35 y.o. G1P0 at [redacted]w[redacted]d  admitted for ghtn  Subjective: No ha, vision change, ruq pain, or sob.  Objective: BP 152/89 mmHg  Pulse 86  Temp(Src) 97.8 F (36.6 C) (Oral)  Resp 18  Ht 5\' 2"  (1.575 m)  Wt 196 lb 8 oz (89.132 kg)  BMI 35.93 kg/m2  LMP 10/16/2014 (Approximate) or  Filed Vitals:   07/25/15 1846 07/25/15 1902 07/25/15 1910 07/25/15 1912  BP: 151/98 93/59 145/92 152/89  Pulse: 91 100 82 86  Temp:      TempSrc:      Resp: 18 18 18 18   Height:      Weight:        135/mod/-a/recurrent variable  Dilation: 9 Effacement (%): 90 Station: +2 Presentation: Vertex Exam by:: Ondra Deboard  Labs: Lab Results  Component Value Date   WBC 13.0* 07/25/2015   HGB 11.9* 07/25/2015   HCT 35.0* 07/25/2015   MCV 85.8 07/25/2015   PLT 229 07/25/2015    Patient Active Problem List   Diagnosis Date Noted  . Gestational hypertension w/o significant proteinuria in 3rd trimester 07/25/2015  . 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 Supervision of high risk pregnancy due to social problems, antepartum 12/18/2014  . Methadone maintenance treatment affecting pregnancy, antepartum ([place]) 12/18/2014  . Depression 04/27/2012  . Anxiety 04/27/2012  . Insomnia 04/27/2012  . Polysubstance abuse 04/25/2012  . Gestational hypertension in third trimester 04/25/2012    Assessment / Plan: 35 y.o. G1P0 at [redacted]w[redacted]d here for iol 2/2 ghtn  Labor: active phase now protracted, have inserted iupc for mvu pit titration Fetal Wellbeing:  Cat 2, will start amnioinfusion Pain Control:  S/p epidural Anticipated MOD:  Vaginal GHTN: BPs improving. On magnesium and latetalol protocol. Asymptomatic. Repeat hellp labs pending  Desma Maxim, MD 07/25/2015, 7:21 PM

## 2015-07-25 NOTE — Progress Notes (Signed)
LABOR PROGRESS NOTE  Rebecca Hancock is a 35 y.o. G1P0 at [redacted]w[redacted]d  admitted for ghtn  Subjective: Mildly painful cramping  Objective: BP 131/76 mmHg  Pulse 64  Temp(Src) 97.8 F (36.6 C) (Oral)  Resp 18  Ht 5\' 2"  (1.575 m)  Wt 196 lb 8 oz (89.132 kg)  BMI 35.93 kg/m2  LMP 10/16/2014 (Approximate) or  Filed Vitals:   07/25/15 0823 07/25/15 0833 07/25/15 0900 07/25/15 1055  BP:    131/76  Pulse:    64  Temp:    97.8 F (36.6 C)  TempSrc:    Oral  Resp:    18  Height: 5\' 2"  (1.575 m) 5\' 2"  (1.575 m) 5\' 2"  (1.575 m)   Weight:  196 lb 8 oz (89.132 kg) 196 lb 8 oz (89.132 kg)     135/mod/+a/-d  Labs: Lab Results  Component Value Date   WBC 9.1 07/25/2015   HGB 12.2 07/25/2015   HCT 35.7* 07/25/2015   MCV 85.4 07/25/2015   PLT 256 07/25/2015    Patient Active Problem List   Diagnosis Date Noted  . Gestational hypertension w/o significant proteinuria in 3rd trimester 07/25/2015  . 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 Supervision of high risk pregnancy due to social problems, antepartum 12/18/2014  . Methadone maintenance treatment affecting pregnancy, antepartum ([place]) 12/18/2014  . Depression 04/27/2012  . Anxiety 04/27/2012  . Insomnia 04/27/2012  . Polysubstance abuse 04/25/2012  . Gestational hypertension in third trimester 04/25/2012    Assessment / Plan: 35 y.o. G1P0 at [redacted]w[redacted]d here for iol 2/2 ghtn.   Labor: now s/p foley and cytotec. Nursing to confirm cervical dilation and then start pitocin Fetal Wellbeing:  Cat 1 Pain Control:  Eventual epidural Anticipated MOD:  Vag GHTN: no meds, asymptomatic, bp wnl, no indication for antihypertensives or mg at this time  Desma Maxim, MD 07/25/2015, 1:19 PM

## 2015-07-26 ENCOUNTER — Encounter (HOSPITAL_COMMUNITY): Payer: Self-pay

## 2015-07-26 DIAGNOSIS — Z3A39 39 weeks gestation of pregnancy: Secondary | ICD-10-CM

## 2015-07-26 DIAGNOSIS — O134 Gestational [pregnancy-induced] hypertension without significant proteinuria, complicating childbirth: Secondary | ICD-10-CM

## 2015-07-26 DIAGNOSIS — F1190 Opioid use, unspecified, uncomplicated: Secondary | ICD-10-CM

## 2015-07-26 DIAGNOSIS — O99324 Drug use complicating childbirth: Secondary | ICD-10-CM

## 2015-07-26 LAB — CBC
HCT: 27.6 % — ABNORMAL LOW (ref 36.0–46.0)
HEMOGLOBIN: 9.4 g/dL — AB (ref 12.0–15.0)
MCH: 29 pg (ref 26.0–34.0)
MCHC: 34.1 g/dL (ref 30.0–36.0)
MCV: 85.2 fL (ref 78.0–100.0)
Platelets: 216 10*3/uL (ref 150–400)
RBC: 3.24 MIL/uL — ABNORMAL LOW (ref 3.87–5.11)
RDW: 13.6 % (ref 11.5–15.5)
WBC: 16.6 10*3/uL — ABNORMAL HIGH (ref 4.0–10.5)

## 2015-07-26 LAB — RPR: RPR: NONREACTIVE

## 2015-07-26 MED ORDER — DOCUSATE SODIUM 100 MG PO CAPS
100.0000 mg | ORAL_CAPSULE | Freq: Two times a day (BID) | ORAL | Status: DC
  Administered 2015-07-26 – 2015-07-27 (×2): 100 mg via ORAL
  Filled 2015-07-26 (×2): qty 1

## 2015-07-26 MED ORDER — BENZOCAINE-MENTHOL 20-0.5 % EX AERO
1.0000 "application " | INHALATION_SPRAY | CUTANEOUS | Status: DC | PRN
  Administered 2015-07-26: 1 via TOPICAL
  Filled 2015-07-26: qty 56

## 2015-07-26 MED ORDER — ACETAMINOPHEN 325 MG PO TABS
650.0000 mg | ORAL_TABLET | ORAL | Status: DC | PRN
  Administered 2015-07-26: 650 mg via ORAL
  Filled 2015-07-26: qty 2

## 2015-07-26 MED ORDER — MAGNESIUM SULFATE 50 % IJ SOLN
2.0000 g/h | INTRAVENOUS | Status: DC
  Filled 2015-07-26: qty 80

## 2015-07-26 MED ORDER — LACTATED RINGERS IV SOLN
INTRAVENOUS | Status: DC
  Administered 2015-07-26 (×2): via INTRAVENOUS

## 2015-07-26 MED ORDER — ONDANSETRON HCL 4 MG/2ML IJ SOLN: 4.0000 mg | INTRAMUSCULAR | Status: DC | PRN

## 2015-07-26 MED ORDER — WITCH HAZEL-GLYCERIN EX PADS: 1.0000 "application " | MEDICATED_PAD | CUTANEOUS | Status: DC | PRN

## 2015-07-26 MED ORDER — PRENATAL MULTIVITAMIN CH
1.0000 | ORAL_TABLET | Freq: Every day | ORAL | Status: DC
  Administered 2015-07-26 – 2015-07-27 (×2): 1 via ORAL
  Filled 2015-07-26 (×2): qty 1

## 2015-07-26 MED ORDER — LANOLIN HYDROUS EX OINT: TOPICAL_OINTMENT | CUTANEOUS | Status: DC | PRN

## 2015-07-26 MED ORDER — DIBUCAINE 1 % RE OINT: 1.0000 "application " | TOPICAL_OINTMENT | RECTAL | Status: DC | PRN

## 2015-07-26 MED ORDER — DIPHENHYDRAMINE HCL 25 MG PO CAPS: 25.0000 mg | ORAL_CAPSULE | Freq: Four times a day (QID) | ORAL | Status: DC | PRN

## 2015-07-26 MED ORDER — MAGNESIUM SULFATE 50 % IJ SOLN
2.0000 g/h | INTRAVENOUS | Status: AC
  Administered 2015-07-26: 2 g/h via INTRAVENOUS
  Filled 2015-07-26: qty 80

## 2015-07-26 MED ORDER — ONDANSETRON HCL 4 MG PO TABS: 4.0000 mg | ORAL_TABLET | ORAL | Status: DC | PRN

## 2015-07-26 MED ORDER — SIMETHICONE 80 MG PO CHEW: 80.0000 mg | CHEWABLE_TABLET | ORAL | Status: DC | PRN

## 2015-07-26 MED ORDER — METHADONE HCL 10 MG/ML PO CONC
94.0000 mg | Freq: Every day | ORAL | Status: DC
  Administered 2015-07-26 – 2015-07-27 (×2): 94 mg via ORAL
  Filled 2015-07-26 (×3): qty 9.4

## 2015-07-26 MED ORDER — IBUPROFEN 600 MG PO TABS
600.0000 mg | ORAL_TABLET | Freq: Four times a day (QID) | ORAL | Status: DC
  Administered 2015-07-26 – 2015-07-27 (×7): 600 mg via ORAL
  Filled 2015-07-26 (×7): qty 1

## 2015-07-26 NOTE — Progress Notes (Signed)
Post Partum Day 1 Subjective:  Rebecca Hancock is a 35 y.o. G1P1001 [redacted]w[redacted]d s/p NSVD.  No acute events overnight.  Pt denies problems with ambulating, voiding or po intake.  She denies nausea or vomiting.  Pain is well controlled.  She has had flatus. Reports mild HA, no blurry vision, no RUQ pain.   Lochia Moderate.  Plan for birth control is IUD.  Method of Feeding: Breast  Objective: Blood pressure 115/58, pulse 69, temperature 97.5 F (36.4 C), temperature source Oral, resp. rate 16, height 5\' 2"  (1.575 m), weight 196 lb 8 oz (89.132 kg), last menstrual period 10/16/2014, SpO2 99 %, unknown if currently breastfeeding.  Physical Exam:  General: alert, cooperative and no distress Lochia:normal flow Chest: normal WOB Heart: Regular rate Abdomen: +BS, soft, mild TTP (appropriate) Uterine Fundus: firm DVT Evaluation: No evidence of DVT seen on physical exam. Extremities: No edema   Recent Labs  07/25/15 1753 07/26/15 0520  HGB 11.9* 9.4*  HCT 35.0* 27.6*    Assessment/Plan:  ASSESSMENT: Jovonna Olds is a 35 y.o. G1P1001 [redacted]w[redacted]d s/p NSVD, pregnancy complicated by gHTN/PRex, methadone use  Plan for discharge tomorrow and Lactation consult Continue routine PP care Breastfeeding support PRN  #Preeclampsia with severe features: continue pp mag for 24 hours.    LOS: 1 day   Caren Macadam 07/26/2015, 7:12 AM

## 2015-07-26 NOTE — Anesthesia Postprocedure Evaluation (Signed)
Anesthesia Post Note  Patient: Rebecca Hancock  Procedure(s) Performed: * No procedures listed *  Patient location during evaluation: Antenatal Anesthesia Type: Epidural Level of consciousness: awake and alert Pain management: pain level controlled Vital Signs Assessment: post-procedure vital signs reviewed and stable Respiratory status: spontaneous breathing, nonlabored ventilation and respiratory function stable Cardiovascular status: stable Postop Assessment: no headache, no backache and epidural receding Anesthetic complications: no    Last Vitals:  Filed Vitals:   07/26/15 0638 07/26/15 0701  BP:  118/54  Pulse:  68  Temp: 36.4 C   Resp:      Last Pain:  Filed Vitals:   07/26/15 0738  PainSc: 0-No pain                 Tharon Bomar

## 2015-07-26 NOTE — Lactation Note (Signed)
This note was copied from a baby's chart. Lactation Consultation Note  Patient Name: Rebecca Hancock [id number] Date: 07/26/2015 Reason for consult: Initial assessment Baby at 65 hr of life and RN requested help latching baby. Mom was having a hard time holding her large breast and the baby in football position on the L side. Tried a #20 NS but baby did not do any better holding on the breast. Mom's nipples appeared inverted when the breast is compressed on the L side.  Mom seemed to be getting aggravated. Switched baby the R side in football position and baby latched after 2 attempts. Mom reported latch was comfortable and swallows were heard. Mom is able to manually express and colostrum was visible bilaterally, spoon in room. Given curved tip syring. She has a DEBP in the room. She would like to bring her's from home for [facility] to review. Discussed baby behavior, feeding frequency, baby belly size, voids, wt loss, breast changes, and nipple care. Given lactation handouts. Aware of OP services and support group.    Maternal Data    Feeding Feeding Type: Breast Fed Length of feed: 12 min (still feeding when LC left )  LATCH Score/Interventions Latch: Repeated attempts needed to sustain latch, nipple held in mouth throughout feeding, stimulation needed to elicit sucking reflex. Intervention(s): Skin to skin;Waking techniques Intervention(s): Adjust position;Assist with latch;Breast compression  Audible Swallowing: Spontaneous and intermittent Intervention(s): Hand expression;Skin to skin  Type of Nipple: Everted at rest and after stimulation  Comfort (Breast/Nipple): Soft / non-tender     Hold (Positioning): Assistance needed to correctly position infant at breast and maintain latch. Intervention(s): Support Pillows;Position options  LATCH Score: 8  Lactation Tools Discussed/Used WIC Program: Yes Pump Review: Setup, frequency, and cleaning;Milk Storage Date initiated::  07/26/15   Consult Status Consult Status: Follow-up Date: 07/27/15 Follow-up type: In-patient    Denzil Hughes 07/26/2015, 5:39 PM

## 2015-07-27 ENCOUNTER — Other Ambulatory Visit: Payer: Medicaid Other

## 2015-07-27 ENCOUNTER — Inpatient Hospital Stay (HOSPITAL_COMMUNITY)
Admission: AD | Admit: 2015-07-27 | Payer: Medicaid Other | Source: Ambulatory Visit | Admitting: Obstetrics and Gynecology

## 2015-07-27 DIAGNOSIS — O1410 Severe pre-eclampsia, unspecified trimester: Secondary | ICD-10-CM

## 2015-07-27 MED ORDER — POLYETHYLENE GLYCOL 3350 17 G PO PACK: 17.0000 g | PACK | Freq: Every day | ORAL | Status: DC | PRN

## 2015-07-27 MED ORDER — IBUPROFEN 600 MG PO TABS: 600.0000 mg | ORAL_TABLET | Freq: Four times a day (QID) | ORAL | Status: DC

## 2015-07-27 NOTE — Progress Notes (Signed)
Post Partum Day 2  Subjective: no complaints, up ad lib, voiding and tolerating PO. Some constipation. States that her pain is controlled with ibuprofen. Breastfeeding this morning. Interested in appointment with clinic for contraception options.   Objective: Blood pressure 122/64, pulse 71, temperature 97.5 F (36.4 C), temperature source Oral, resp. rate 18, height 5\' 2"  (1.575 m), weight 89.132 kg (196 lb 8 oz), last menstrual period 10/16/2014, SpO2 97 %, currently breastfeeding, supplementing with formula.   Physical Exam:  General: alert, cooperative and no distress Lochia: appropriate Uterine Fundus: firm Incision: NA DVT Evaluation: No evidence of DVT seen on physical exam. Negative Homan's sign.   Recent Labs  07/25/15 1753 07/26/15 0520  HGB 11.9* 9.4*  HCT 35.0* 27.6*    Assessment/Plan: Plan for discharge tomorrow and Breastfeeding.   LOS: 2 days   Gweneth Fritter 07/27/2015, 7:31 AM

## 2015-07-27 NOTE — Progress Notes (Signed)
CSW attempted to meet with MOB to introduce self and complete psychosocial assessment. MOB was sleeping. CSW to follow up.

## 2015-07-27 NOTE — Progress Notes (Signed)
Pt given discharge instructions and knows of follow up appt for tomorrow at 1015 for BP check at the high risk clinic and a smart [facility] nurse to check BP the following week.

## 2015-07-27 NOTE — Care Management (Signed)
Received call from Lloyd on unit stating that patient needs B/P check tomorrow and in one week.  CM checked with [facility] and they were unable to do and also called the [facility] RN- Silvana Newness # [phone] and asked her if she could check B/P tomorrow and in one week.  Helene Kelp stated that she could provide an RN from [employer] program  in one week to check B/P on 08/03/15 but would not be able to do b/p check tomorrow.  CM notified RN Pam that patient will need go to the clinic tomorrow and have her blood pressure checked as per doctor's orders on AVS.

## 2015-07-27 NOTE — Discharge Summary (Signed)
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 yo G1P1001 at [redacted]w[redacted]d was admitted to the hospital 07/25/2015 for induction of labor.  Indication for induction: Gestational hypertension.  Patient had a labor course as follows: Membrane Rupture Time/Date: 4:30 PM ,07/25/2015   Intrapartum Procedures: Episiotomy: None [1]                                         Lacerations:  1st degree [2];Periurethral [8];Perineal [11]  Patient had delivery of a Viable infant.  Information for the patient's newborn:  Shanet, Shariff [id number]  Delivery Method: Vaginal, Spontaneous Delivery (Filed from Delivery Summary)   Patient diagnosed with preeclampsia with severe features (proteinuria, severe hypertension) intrapartum and was started on magnesium which was continued for 24 hours postpartum. She was treated with labetalol prn. Blood pressures after cessation of magnesium wnl or mildly elevated so not discharging with antihypertensive prescription.  She was given preeclampsia return precautions and we have ordered a nurse visit for BP check within 24 hours and again in one week. Instructions given to present to our clinic for these BP checks if nursing does not visit.  07/25/2015  Details of delivery can be found in separate delivery note.  Patient had a routine postpartum course. Patient is discharged home 07/27/2015.   Physical exam  Filed Vitals:   07/27/15 0425 07/27/15 0500 07/27/15 0855 07/27/15 1243  BP: 141/89 122/64 139/92 137/92  Pulse: 72 71 69 70  Temp:   98 F (36.7 C) 98.1 F (36.7 C)  TempSrc:   Oral Oral  Resp:   18 17  Height:      Weight:      SpO2:       General: alert, cooperative and no distress Lochia: appropriate Uterine Fundus: firm Incision: N/A DVT Evaluation: No cords or calf tenderness. No significant calf/ankle edema. Labs: Lab Results  Component Value Date   WBC 16.6* 07/26/2015   HGB 9.4* 07/26/2015   HCT 27.6* 07/26/2015   MCV 85.2 07/26/2015   PLT 216 07/26/2015   CMP Latest Ref Rng 07/25/2015  Glucose 65 - 99 mg/dL 88  BUN 6 - 20 mg/dL 11  Creatinine 0.44 - 1.00 mg/dL 0.79  Sodium 135 - 145 mmol/L 137  Potassium 3.5 - 5.1 mmol/L 4.1  Chloride 101 - 111 mmol/L 105  CO2 22 - 32 mmol/L 24  Calcium 8.9 - 10.3 mg/dL 9.0  Total Protein 6.5 - 8.1 g/dL 6.4(L)  Total Bilirubin 0.3 - 1.2 mg/dL 0.2(L)  Alkaline Phos 38 - 126 U/L 114  AST 15 - 41 U/L 27  ALT 14 - 54 U/L 15    Discharge instruction: per After Visit Summary and "Baby and Me Booklet".  After visit meds:    Medication List    STOP taking these medications        diphenhydrAMINE 25 mg capsule  Commonly known as:  BENADRYL      TAKE these medications        acetaminophen 325 MG tablet  Commonly known as:  TYLENOL  Take 2 tablets (650 mg total) by mouth every 6 (six) hours as needed.     docusate sodium 100 MG capsule  Commonly known as:  COLACE  Take 100 mg by mouth daily as needed for mild constipation.  Reported on 05/26/2015     ibuprofen 600 MG tablet  Commonly known as:  ADVIL,MOTRIN  Take 1 tablet (600 mg total) by mouth every 6 (six) hours.     Lactulose 20 GM/30ML Soln  Take 30 mLs (20 g total) by mouth 2 (two) times daily as needed (constipation).     methadone 10 MG/ML solution  Commonly known as:  DOLOPHINE  Take 94 mg by mouth daily.     Prenatal Vitamins 0.8 MG tablet  Take 1 tablet by mouth daily.        Diet: routine diet  Activity: Advance as tolerated. Pelvic rest for 6 weeks.   Outpatient follow up:6 weeks Follow up Appt:No future appointments. Follow up Visit:No Follow-up on file.  Postpartum contraception: Mirena  Newborn Data: Live born female  Birth Weight: 6 lb 5.8 oz (2886 g) APGAR: 8, 9  Baby Feeding: Bottle and Breast Disposition:NICU  (NAS protocol).  07/27/2015 Desma Maxim, MD

## 2015-07-27 NOTE — Lactation Note (Signed)
This note was copied from a baby's chart. Lactation Consultation Note  Mom states baby is latching without nipple shield.  She is also supplementing with small amount of colostrum and formula.  Instructed to continue to post pump every 3 hours to increase milk volume.  Encouraged to call for latch assist/concerns prn.  Patient Name: Rebecca Hancock [id number] Date: 07/27/2015     Maternal Data    Feeding Feeding Type: Formula Nipple Type: Slow - flow  LATCH Score/Interventions                      Lactation Tools Discussed/Used     Consult Status      Ave Filter 07/27/2015, 9:44 AM

## 2015-07-27 NOTE — Discharge Instructions (Signed)
Preeclampsia and Eclampsia Preeclampsia is a serious condition that develops only during pregnancy. It is also called toxemia of pregnancy. This condition causes high blood pressure along with other symptoms, such as swelling and headaches. These may develop as the condition gets worse. Preeclampsia may occur 20 weeks or later into your pregnancy.  Diagnosing and treating preeclampsia early is very important. If not treated early, it can cause serious problems for you and your baby. One problem it can lead to is eclampsia, which is a condition that causes muscle jerking or shaking (convulsions) in the mother. Delivering your baby is the best treatment for preeclampsia or eclampsia.  RISK FACTORS The cause of preeclampsia is not known. You may be more likely to develop preeclampsia if you have certain risk factors. These include:   Being pregnant for the first time.  Having preeclampsia in a past pregnancy.  Having a family history of preeclampsia.  Having high blood pressure.  Being pregnant with twins or triplets.  Being 3 or older.  Being African American.  Having kidney disease or diabetes.  Having medical conditions such as lupus or blood diseases.  Being very overweight (obese). SIGNS AND SYMPTOMS  The earliest signs of preeclampsia are:  High blood pressure.  Increased protein in your urine. Your health care provider will check for this at every prenatal visit. Other symptoms that can develop include:   Severe headaches.  Sudden weight gain.  Swelling of your hands, face, legs, and feet.  Feeling sick to your stomach (nauseous) and throwing up (vomiting).  Vision problems (blurred or double vision).  Numbness in your face, arms, legs, and feet.  Dizziness.  Slurred speech.  Sensitivity to bright lights.  Abdominal pain. DIAGNOSIS  There are no screening tests for preeclampsia. Your health care provider will ask you about symptoms and check for signs of  preeclampsia during your prenatal visits. You may also have tests, including:  Urine testing.  Blood testing.  Checking your baby's heart rate.  Checking the health of your baby and your placenta using images created with sound waves (ultrasound). TREATMENT  You can work out the best treatment approach together with your health care provider. It is very important to keep all prenatal appointments. If you have an increased risk of preeclampsia, you may need more frequent prenatal exams.  Your health care provider may prescribe bed rest.  You may have to eat as little salt as possible.  You may need to take medicine to lower your blood pressure if the condition does not respond to more conservative measures.  You may need to stay in the hospital if your condition is severe. There, treatment will focus on controlling your blood pressure and fluid retention. You may also need to take medicine to prevent seizures.  If the condition gets worse, your baby may need to be delivered early to protect you and the baby. You may have your labor started with medicine (be induced), or you may have a cesarean delivery.  Preeclampsia usually goes away after the baby is born. HOME CARE INSTRUCTIONS   Only take over-the-counter or prescription medicines as directed by your health care provider.  Lie on your left side while resting. This keeps pressure off your baby.  Elevate your feet while resting.  Get regular exercise. Ask your health care provider what type of exercise is safe for you.  Avoid caffeine and alcohol.  Do not smoke.  Drink 6-8 glasses of water every day.  Eat a balanced diet  that is low in salt. Do not add salt to your food.  Avoid stressful situations as much as possible.  Get plenty of rest and sleep.  Keep all prenatal appointments and tests as scheduled. SEEK MEDICAL CARE IF:  You are gaining more weight than expected.  You have any headaches, abdominal pain, or  nausea.  You are bruising more than usual.  You feel dizzy or light-headed. SEEK IMMEDIATE MEDICAL CARE IF:   You develop sudden or severe swelling anywhere in your body. This usually happens in the legs.  You gain 5 lb (2.3 kg) or more in a week.  You have a severe headache, dizziness, problems with your vision, or confusion.  You have severe abdominal pain.  You have lasting nausea or vomiting.  You have a seizure.  You have trouble moving any part of your body.  You develop numbness in your body.  You have trouble speaking.  You have any abnormal bleeding.  You develop a stiff neck.  You pass out. MAKE SURE YOU:   Understand these instructions.  Will watch your condition.  Will get help right away if you are not doing well or get worse.   This information is not intended to replace advice given to you by your health care provider. Make sure you discuss any questions you have with your health care provider.   Document Released: 05/20/2000 Document Revised: 05/28/2013 Document Reviewed: 03/15/2013 Elsevier Interactive Patient Education 2016 Elsevier [employer]. Vaginal Delivery, Care After Refer to this sheet in the next few weeks. These discharge instructions provide you with information on caring for yourself after delivery. Your health care provider may also give you specific instructions. Your treatment has been planned according to the most current medical practices available, but problems sometimes occur. Call your health care provider if you have any problems or questions after you go home. HOME CARE INSTRUCTIONS  Take over-the-counter or prescription medicines only as directed by your health care provider or pharmacist.  Do not drink alcohol, especially if you are breastfeeding or taking medicine to relieve pain.  Do not chew or smoke tobacco.  Do not use illegal drugs.  Continue to use good perineal care. Good perineal care includes:  Wiping your perineum  from front to back.  Keeping your perineum clean.  Do not use tampons or douche until your health care provider says it is okay.  Shower, wash your hair, and take tub baths as directed by your health care provider.  Wear a well-fitting bra that provides breast support.  Eat healthy foods.  Drink enough fluids to keep your urine clear or pale yellow.  Eat high-fiber foods such as whole grain cereals and breads, brown rice, beans, and fresh fruits and vegetables every day. These foods may help prevent or relieve constipation.  Follow your health care provider's recommendations regarding resumption of activities such as climbing stairs, driving, lifting, exercising, or traveling.  Talk to your health care provider about resuming sexual activities. Resumption of sexual activities is dependent upon your risk of infection, your rate of healing, and your comfort and desire to resume sexual activity.  Try to have someone help you with your household activities and your newborn for at least a few days after you leave the hospital.  Rest as much as possible. Try to rest or take a nap when your newborn is sleeping.  Increase your activities gradually.  Keep all of your scheduled postpartum appointments. It is very important to keep your scheduled follow-up  appointments. At these appointments, your health care provider will be checking to make sure that you are healing physically and emotionally. SEEK MEDICAL CARE IF:   You are passing large clots from your vagina. Save any clots to show your health care provider.  You have a foul smelling discharge from your vagina.  You have trouble urinating.  You are urinating frequently.  You have pain when you urinate.  You have a change in your bowel movements.  You have increasing redness, pain, or swelling near your vaginal incision (episiotomy) or vaginal tear.  You have pus draining from your episiotomy or vaginal tear.  Your episiotomy or  vaginal tear is separating.  You have painful, hard, or reddened breasts.  You have a severe headache.  You have blurred vision or see spots.  You feel sad or depressed.  You have thoughts of hurting yourself or your newborn.  You have questions about your care, the care of your newborn, or medicines.  You are dizzy or light-headed.  You have a rash.  You have nausea or vomiting.  You were breastfeeding and have not had a menstrual period within 12 weeks after you stopped breastfeeding.  You are not breastfeeding and have not had a menstrual period by the 12th week after delivery.  You have a fever. SEEK IMMEDIATE MEDICAL CARE IF:   You have persistent pain.  You have chest pain.  You have shortness of breath.  You faint.  You have leg pain.  You have stomach pain.  Your vaginal bleeding saturates two or more sanitary pads in 1 hour.   This information is not intended to replace advice given to you by your health care provider. Make sure you discuss any questions you have with your health care provider.   Document Released: 05/20/2000 Document Revised: 02/11/2015 Document Reviewed: 01/18/2012 Elsevier Interactive Patient Education [employer].

## 2015-07-27 NOTE — Progress Notes (Signed)
Epidural removed as ordered. All parts accounted for. Tip was intact. No bleeding noted, sited covered with Band Aid. Pt tolerated procedure well.

## 2015-07-27 NOTE — Clinical Social Work Maternal (Signed)
CLINICAL SOCIAL WORK MATERNAL/CHILD NOTE  Patient Details  Name: Adrien Frydrych MRN: [id number] Date of Birth: 1980/12/08  Date:  07/27/2015  Clinical Social Worker Initiating Note:  Lucita Ferrara MSW, LCSW Date/ Time Initiated:  07/27/15/1130    Child's Name:  Bartolo Darter   Legal Guardian:  Clint Guy and Lorenda Cahill  Need for Interpreter:    None  Date of Referral:  07/25/15     Reason for Referral:  History of anxiety, Methadone maintenance program, infant at risk for NAS  Referral Source:  [facility]   Address:  [address] [place], [place] [address]  Phone number:  [phone]   Household Members:  Significant Other   Natural Supports (not living in the home):  Immediate Family, Extended Family   Professional Supports: [facility]   Employment: Homemaker   Type of Work:   N/A  Education:    N/A  Museum/gallery curator Resources:  Medicaid   Other Resources:  [facility]   Cultural/Religious Considerations Which May Impact Care:  None reported  Strengths:  Ability to meet basic needs , Home prepared for child    Risk Factors/Current Problems:   1. Mental Health Concerns: MOB presents with a history of anxiety and depression. MOB denied need for treatment in past year.  2. 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 Infant's UDS is negative, cord tissue is pending.   Cognitive State:  Able to Concentrate , Alert , Goal Oriented , Linear Thinking    Mood/Affect:  Happy , Interested , Bright    CSW Assessment:  CSW received request for consult due to MOB's current participation in a methadone maitenance program and history of anxiety.  MOB provided consent for the FOB to remain in the room during the CSW assessment.  MOB and FOB stated that some of their family members do not know that MOB is currently participating in a methadone maintenance  program.  MOB and FOB presented as easily engaged and receptive to the visit. MOB was feeding the infant during the assessment, and the FOB was observed to be closely tracking infant's feedings, sneezes, on their log to monitor for NAS.  MOB endorsed feelings of happiness and excitement secondary to the infant's birth. She stated that she continues to process and adjust to the changes in regards to her ideal childbirth experience since she had to be induced. MOB discussed how the induction led to subsequent medical interventions, and she had hoped for a "natural" childbirth. MOB shared that she felt well supported by staff during the process, and reported that she is maintaining the perspective that she and the infant are healthy.  MOB shared that she knows that a healthy outcome does not minimize her feelings, but discussed how it helps her to cope with her less than ideal amount of medical interventions for the infant's birth.  MOB shared that she is attempting to breastfeed, but that the infant is having a difficult latch.  She expressed attempting to have patience with the process since the infant is still young and is learning.   MOB and FOB endorsed increase in stress and anxiety associated with monitoring the infant for NAS.  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  MOB stated that she feels  well supported by the clinic, and reported that she attends groups and meets with her individual counselor. MOB shared that she is proud of her recovery, and reflected upon the increased motivation she felt to continue in her journey of recovery during the pregnancy as she prepared to transition to becoming a mother.  She shared that now the infant has been born, she continues to feel motivated since she wants to be a present mother for the infant.  MOB shared belief that the support from  the FOB and [facility] have been helpful in her recovery. MOB denied need for extra help and support as she continues in her recovery. MOB smiled as she discussed her upcoming one year anniversary of sobriety.   MOB and FOB verbalized understanding of need to monitor for NAS. MOB stated that she received a NICU tour during her pregnancy which has been helpful for her to know what to anticipate and expect if she needs to be transferred. FOB stated that he has less anxiety now that he has learned more, since he had receive misinformation previously which led to him believe that the infant was doing worse than she actually is (preivously thought NAS was scored on a scale of 0-10).  MOB and FOB stated that they hope that the infant can remain in their room for as long as possible in order to provide comfort measures, but are agreeable to a transfer if it is determined best for the infant.  MOB and FOB shared that numerous members of their support system do not know that the MOB is a methadone maintenance program, and that they are concerned about what to inform their families. They discussed not wanting to lie, but also shared that they are unsure if their families will be understanding.   MOB denied any feelings of guilt associated with monitoring for NAS. MOB stated that she feels "bad" that the infant is exhibiting symptoms, but shared that it is because she does not want the infant to be in pain. She discussed how she knows that participating in her program is best for her, the infant, and their family.  MOB and FOB were receptive to exploring how to view this time as a short/brief stressor, and then they will receive the subsequent long term benefits as a result of her participation in the program and continuing in her recovery.   MOB confirmed history of depression and anxiety, but stated that in the past year she has not identified her depression/anxiety as a concern. She stated that she has previously been  prescribed medications (at least 10 different medications per MOB's report), but found success with Lexapro and Celexa. MOB denied increase in symptoms during the pregnancy, but acknowledged that she is currently in a high risk time for increase in symptoms. MOB and FOB were receptive to education on perinatal mood and anxiety disorders, and agreed to closely monitor for symptoms. CSW highlighted MOB's risk factors, and reviewed self-care activities to support her mental health. MOB stated that she feels as though she can contact her counselor at [facility] if needs arise, and discussed how they have partnerships with mental health providers if the community if needed. MOB expressed familiarity with [facility] [place] as well.   CSW informed MOB and FOB of the hospital drug screen policy, and they denied questions or concerns. MOB and FOB aware that infant's UDS is negative and cord tissue is pending.  MOB and FOB expressed confidence that the infant's  cord tissue will only be positive for methadone.   MOB and FOB expressed appreciation for the visit and support, and acknowledged ongoing CSW availability if needs arise.    CSW Plan/Description:   1. Patient/Family Education-- Perinatal mood and anxiety disorders, hospital drug screen policy 2. CSW to monitor infant's toxicology screens, and will refer to CPS if positive.  3. No Further Intervention Required/No Barriers to Discharge    Sharyl Nimrod 07/27/2015, 12:42 PM

## 2015-07-27 NOTE — Lactation Note (Signed)
This note was copied from a baby's chart. Lactation Consultation Note  Baby transferred to NICU and mom will be discharged this PM. Assisted mom with setting up and use of Medela freestyle pump.  Mom prefers to use symphony pump because freestyle is noisy.  Pump referral faxed to [facility] office.  Encouraged to continue pumping every 3 hours to induce lactation.  Patient Name: Rebecca Hancock [id number] Date: 07/27/2015     Maternal Data    Feeding Feeding Type: Bottle Fed - Formula Nipple Type: Slow - flow  LATCH Score/Interventions                      Lactation Tools Discussed/Used     Consult Status      Ave Filter 07/27/2015, 3:08 PM

## 2015-07-28 ENCOUNTER — Ambulatory Visit: Payer: Medicaid Other

## 2015-07-28 VITALS — BP 124/78

## 2015-07-28 DIAGNOSIS — Z0130 Encounter for examination of blood pressure without abnormal findings: Secondary | ICD-10-CM

## 2015-07-28 NOTE — Progress Notes (Signed)
Pt here today for BP check due to elevated BP post delivery.  BP 124/78 manual in RA.  Notified Hansel Feinstein, CNM no new orders.  Pt scheduled PP visit and advised to call with any sx of HTN.  Pt agreed.

## 2015-07-29 ENCOUNTER — Ambulatory Visit: Payer: Self-pay

## 2015-07-29 NOTE — Lactation Note (Signed)
This note was copied from a baby's chart. Lactation Consultation Note  Patient Name: Rebecca Hancock [id number] Date: 07/29/2015 Reason for consult: Follow-up assessment;NICU baby NICU baby 66 hours old. Called to assist mom with latching baby in NICU. Baby in d/t high NAS scores. Mom reports that baby too "frantic" to latch when wakes up cueing to eat. Parents have just given a bottle of EBM, and baby still cueing for more. Assisted to latch baby to right breast in football position. Baby latch deeply with "teacup" hold. Baby easily overstimulated and will pull off breast, but mom is able to re-latch. Baby has bursts of rhythmic suckling with intermittent swallows, then pulls off or falls asleep at breast. Enc mom to keep baby STS and just keep re-latching baby as needed. Mom has easily compressible and expressible breast. Mom pumped once today. Discussed supply and demand and the importance of pumping 8 times/24 hours followed by hand expression. Discussed a pumping schedule that allows mom to sleep at night and pump more often in the morning.   Maternal Data    Feeding Feeding Type: Breast Fed Length of feed:  (LC assessed first 10 minutes of BF.)  LATCH Score/Interventions Latch: Repeated attempts needed to sustain latch, nipple held in mouth throughout feeding, stimulation needed to elicit sucking reflex. Intervention(s): Skin to skin Intervention(s): Adjust position;Assist with latch;Breast compression  Audible Swallowing: Spontaneous and intermittent  Type of Nipple: Everted at rest and after stimulation  Comfort (Breast/Nipple): Soft / non-tender     Hold (Positioning): Assistance needed to correctly position infant at breast and maintain latch. Intervention(s): Breastfeeding basics reviewed;Support Pillows;Position options;Skin to skin  LATCH Score: 8  Lactation Tools Discussed/Used     Consult Status Consult Status: PRN    TIMARA, DOHNER 07/29/2015, 3:38  PM

## 2015-08-20 ENCOUNTER — Ambulatory Visit: Payer: Self-pay

## 2015-08-20 NOTE — Lactation Note (Signed)
This note was copied from a baby's chart. Lactation Consultation Note  Follow up visit made.  Mom currently has baby latched to breast and baby is nursing well.  Mom reports pumping 8 times per day and obtains 60-120 mls.  Her goal is to exclusively breastfeed baby.  Baby may be transferred to Peds.  Encouraged to follow up for lactation outpatient appointment after baby is discharged.  Patient Name: Rebecca Hancock [id number] Date: 08/20/2015     Maternal Data    Feeding    LATCH Score/Interventions                      Lactation Tools Discussed/Used     Consult Status      Ave Filter 08/20/2015, 2:28 PM

## 2015-09-02 ENCOUNTER — Ambulatory Visit: Payer: Medicaid Other | Admitting: Obstetrics & Gynecology

## 2015-09-21 ENCOUNTER — Ambulatory Visit: Payer: Medicaid Other | Admitting: Obstetrics and Gynecology

## 2015-09-24 ENCOUNTER — Ambulatory Visit (INDEPENDENT_AMBULATORY_CARE_PROVIDER_SITE_OTHER): Payer: Medicaid Other | Admitting: Medical

## 2015-09-24 ENCOUNTER — Encounter: Payer: Self-pay | Admitting: Medical

## 2015-09-24 VITALS — BP 118/74 | HR 59 | Wt 191.2 lb

## 2015-09-24 DIAGNOSIS — R609 Edema, unspecified: Secondary | ICD-10-CM

## 2015-09-24 DIAGNOSIS — Z8759 Personal history of other complications of pregnancy, childbirth and the puerperium: Secondary | ICD-10-CM | POA: Insufficient documentation

## 2015-09-24 MED ORDER — HYDROCHLOROTHIAZIDE 12.5 MG PO CAPS: 12.5000 mg | ORAL_CAPSULE | Freq: Every day | ORAL | Status: DC

## 2015-09-24 NOTE — Patient Instructions (Signed)

## 2015-09-24 NOTE — Progress Notes (Signed)
Subjective:     Rebecca Hancock is a 35 y.o. female who presents for a postpartum visit. She is 8 weeks postpartum following a spontaneous vaginal delivery. I have fully reviewed the prenatal and intrapartum course. The delivery was at 60 gestational weeks. Outcome: spontaneous vaginal delivery. Anesthesia: epidural. Postpartum course has been normal. Baby's course has been complicated by NICU/Peds admission. Baby is still at [facility] inpatient. Baby is feeding by both breast and bottle - Similac Alimentum. Bleeding staining only. Bowel function is normal. Bladder function is normal. Patient is sexually active. Contraception method is none. Postpartum depression screening: negative.  The following portions of the patient's history were reviewed and updated as appropriate: allergies, current medications, past family history, past medical history, past social history, past surgical history and problem list.  Review of Systems Pertinent items are noted in HPI.   Objective:    BP 118/74 mmHg  Pulse 59  Wt 191 lb 3.2 oz (86.728 kg)  General:  alert and cooperative   Breasts:  not performed  Lungs: clear to auscultation bilaterally  Heart:  regular rate and rhythm, S1, S2 normal, no murmur, click, rub or gallop  Abdomen: soft, non-tender; bowel sounds normal; no masses,  no organomegaly   Vulva:  not evaluated  Vagina: not evaluated  Cervix:  not evaluated  Corpus: not examined  Adnexa:  not evaluated  Rectal Exam: Not performed.         MDM Patient does not wish to discuss birth control at this time.  Discussed patient's swelling and history of GHTN and pre-eclampsia with Dr. Roselie Awkward. He recommends 12.5 mg HCTZ for 2 weeks and follow-up with PCP.   Assessment:     Normal postpartum exam. Pap smear not done at today's visit.  Normal pap smear with negative HPV 03/2015. Next Pap Smear due 03/2020.  Fatigue BLE edema   Plan:    1. Contraception: none  2. Rx for HCTZ sent to patient's pharmacy.  Advised to follow-up with PCP in ~ 2 weeks for re-check.  3.  Follow up in: 1 year for annual exam or as needed.

## 2016-06-13 ENCOUNTER — Ambulatory Visit: Payer: Medicaid Other | Admitting: Obstetrics and Gynecology

## 2017-08-07 ENCOUNTER — Encounter: Payer: Self-pay | Admitting: *Deleted

## 2018-01-01 ENCOUNTER — Encounter (HOSPITAL_COMMUNITY): Payer: Self-pay

## 2018-01-01 ENCOUNTER — Ambulatory Visit (HOSPITAL_COMMUNITY)
Admission: EM | Admit: 2018-01-01 | Discharge: 2018-01-01 | Disposition: A | Discharge status: Home / Self Care | Payer: Medicaid Other | Attending: Family Medicine | Admitting: Family Medicine

## 2018-01-01 ENCOUNTER — Other Ambulatory Visit: Payer: Self-pay

## 2018-01-01 DIAGNOSIS — K0889 Other specified disorders of teeth and supporting structures: Secondary | ICD-10-CM | POA: Diagnosis not present

## 2018-01-01 DIAGNOSIS — R21 Rash and other nonspecific skin eruption: Secondary | ICD-10-CM

## 2018-01-01 MED ORDER — NYSTATIN 100000 UNIT/GM EX POWD: Freq: Three times a day (TID) | CUTANEOUS | 0 refills | Status: DC

## 2018-01-01 MED ORDER — IBUPROFEN 800 MG PO TABS: 800.0000 mg | ORAL_TABLET | Freq: Three times a day (TID) | ORAL | 0 refills | Status: AC

## 2018-01-01 MED ORDER — TRIAMCINOLONE ACETONIDE 0.1 % EX CREA: 1.0000 "application " | TOPICAL_CREAM | Freq: Two times a day (BID) | CUTANEOUS | 0 refills | Status: DC

## 2018-01-01 NOTE — ED Provider Notes (Signed)
[place]    CSN: [id number] Arrival date & time: 01/01/18  1421     History   Chief Complaint Chief Complaint  Patient presents with  . Poison Ivy  . Dental Pain    HPI Rebecca Hancock is a 37 y.o. female.   Charita presents with rash to legs as well as torso. States started out to her legs, she felt she was exposed to poison ivy after working in her yard. This started approximately 1 week ago. No pain. Itches. Has been scratching. Rash to torso developed later, she states she had been applying cream to this area and had been moist. No pain, itching. Also complaints of dental pain to left lower jaw. Has been ongoing for weeks, maybe months. Had an episode of swelling 3 weeks ago which resolved. Still with pain, ibuprofen helps. Has not seen a dentist. Hx of anxiety, depression, drug abuse.    ROS per HPI.      Past Medical History:  Diagnosis Date  . Anxiety   . Anxiety 04/27/2012  . Cholelithiasis 04/2012  . Depression   . Depression 04/27/2012  . 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 Insomnia   . Insomnia 04/27/2012  . Insomnia     Patient Active Problem List   Diagnosis Date Noted  . History of severe pre-eclampsia 09/24/2015  . 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 Methadone maintenance treatment affecting pregnancy, antepartum ([place]) 12/18/2014  . Depression 04/27/2012  . Anxiety 04/27/2012  . Insomnia 04/27/2012  . History of drug abuse in remission 04/25/2012    Past Surgical History:  Procedure Laterality Date  . CHOLECYSTECTOMY  04/26/2012   Procedure: LAPAROSCOPIC CHOLECYSTECTOMY;  Surgeon: Gwenyth Ober, MD;  Location: [facility] OR;  Service: General;  Laterality: N/A;    OB History    Gravida  1   Para  1   Term  1   Preterm      AB      Living  1     SAB      TAB      Ectopic      Multiple  0   Live Births  1            Home Medications    Prior to Admission medications   Medication Sig Start Date End Date  Taking? Authorizing Provider  ibuprofen (ADVIL,MOTRIN) 800 MG tablet Take 1 tablet (800 mg total) by mouth 3 (three) times daily. 01/01/18   Zigmund Gottron, NP  methadone (DOLOPHINE) 10 MG/ML solution Take 94 mg by mouth daily.     [provider]  nystatin (MYCOSTATIN/NYSTOP) powder Apply topically 3 (three) times daily. 01/01/18   Augusto Gamble B, NP  triamcinolone cream (KENALOG) 0.1 % Apply 1 application topically 2 (two) times daily. 01/01/18   Zigmund Gottron, NP    Family History Family History  Problem Relation Age of Onset  . Hepatitis B Father   . Cancer Father        prostate    Social History Social History   Tobacco Use  . Smoking status: Current Every Day Smoker    Packs/day: 0.50    Years: 10.00    Pack years: 5.00    Types: Cigarettes, E-cigarettes  . Smokeless tobacco: Never Used  Substance Use Topics  . Alcohol use: No    Alcohol/week: 2.4 oz    Types: 1 Cans of beer, 3 Standard drinks or equivalent per week  Comment: occasional  . Drug use: No    Types: Heroin, Cocaine    Comment: Daily use of heroin- has been clean for over 1 yr     Allergies   Patient has no known allergies.   Review of Systems Review of Systems   Physical Exam Triage Vital Signs ED Triage Vitals  Enc Vitals Group     BP 01/01/18 1528 (!) 141/90     Pulse Rate 01/01/18 1528 64     Resp 01/01/18 1528 16     Temp 01/01/18 1528 97.9 F (36.6 C)     Temp Source 01/01/18 1528 Oral     SpO2 01/01/18 1528 98 %     Weight 01/01/18 1531 160 lb (72.6 kg)     Height --      Head Circumference --      Peak Flow --      Pain Score 01/01/18 1531 6     Pain Loc --      Pain Edu? --      Excl. in [place]? --    No data found.  Updated Vital Signs BP (!) 141/90 (BP Location: Right Arm)   Pulse 64   Temp 97.9 F (36.6 C) (Oral)   Resp 16   Wt 160 lb (72.6 kg)   LMP 01/01/2018   SpO2 98%   BMI 29.26 kg/m    Physical Exam  Constitutional: She is oriented to  person, place, and time. She appears well-developed and well-nourished. No distress.  HENT:  Mouth/Throat: Dental caries present.  Large caries/erosion to tooth #21 with mild tenderness; no discharge, swelling or gum redness or swelling   Cardiovascular: Normal rate, regular rhythm and normal heart sounds.  Pulmonary/Chest: Effort normal and breath sounds normal.  Neurological: She is alert and oriented to person, place, and time.  Skin: Skin is warm and dry. Rash noted.  Scattered rash to legs, see photo, varied stages of healing and scabs related to scratching; no surrounding redness or drainage; bilateral under breast and to pant line with blanchable red rash; no vesicles or pustules, no raised edges             UC Treatments / Results  Labs (all labs ordered are listed, but only abnormal results are displayed) Labs Reviewed - No data to display  EKG None  Radiology No results found.  Procedures Procedures (including critical care time)  Medications Ordered in UC Medications - No data to display  Initial Impression / Assessment and Plan / UC Course  I have reviewed the triage vital signs and the nursing notes.  Pertinent labs & imaging results that were available during my care of the patient were reviewed by me and considered in my medical decision making (see chart for details).     Torso appears candidal rash related to moisture, nystatin provided. Kenalog for leg lesions provided. Ibuprofen for tooth pain, follow up with dentist. No indication of infection at this time. If symptoms worsen or do not improve in the next week to return to be seen or to follow up with PCP.  Patient verbalized understanding and agreeable to plan.    Final Clinical Impressions(s) / UC Diagnoses   Final diagnoses:  Pain, dental  Rash     Discharge Instructions     Please follow up with dentist for definitive treatment of tooth.  Ibuprofen for pain control.  Ice application to  jaw line.  Kenalog cream to help with itching to the legs.  Nystatin to breast and abdomen rash. Keep area dry.  If symptoms worsen or do not improve in the next week to return to be seen or to follow up with your PCP.       ED Prescriptions    Medication Sig Dispense Auth. Provider   triamcinolone cream (KENALOG) 0.1 % Apply 1 application topically 2 (two) times daily. 30 g Augusto Gamble B, NP   ibuprofen (ADVIL,MOTRIN) 800 MG tablet Take 1 tablet (800 mg total) by mouth 3 (three) times daily. 21 tablet Augusto Gamble B, NP   nystatin (MYCOSTATIN/NYSTOP) powder Apply topically 3 (three) times daily. 15 g Zigmund Gottron, NP     Controlled Substance Prescriptions [place] Controlled Substance Registry consulted? Not Applicable   Zigmund Gottron, NP 01/01/18 [phone]

## 2018-01-01 NOTE — ED Triage Notes (Signed)
Poison ivy 1 week and toothache 1 month.

## 2018-01-01 NOTE — Discharge Instructions (Addendum)
Please follow up with dentist for definitive treatment of tooth.  Ibuprofen for pain control.  Ice application to jaw line.  Kenalog cream to help with itching to the legs.  Nystatin to breast and abdomen rash. Keep area dry.  If symptoms worsen or do not improve in the next week to return to be seen or to follow up with your PCP.

## 2018-07-28 ENCOUNTER — Encounter (HOSPITAL_COMMUNITY): Payer: Self-pay | Admitting: *Deleted

## 2018-07-28 ENCOUNTER — Other Ambulatory Visit: Payer: Self-pay

## 2018-07-28 ENCOUNTER — Ambulatory Visit (HOSPITAL_COMMUNITY)
Admission: EM | Admit: 2018-07-28 | Discharge: 2018-07-28 | Disposition: A | Discharge status: Home / Self Care | Payer: Medicaid Other | Attending: Family Medicine | Admitting: Family Medicine

## 2018-07-28 DIAGNOSIS — S0511XA Contusion of eyeball and orbital tissues, right eye, initial encounter: Secondary | ICD-10-CM | POA: Diagnosis not present

## 2018-07-28 MED ORDER — TOBRAMYCIN 0.3 % OP SOLN: 1.0000 [drp] | Freq: Four times a day (QID) | OPHTHALMIC | 0 refills | Status: DC
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
## 2020-08-20 ENCOUNTER — Encounter (HOSPITAL_BASED_OUTPATIENT_CLINIC_OR_DEPARTMENT_OTHER): Payer: Self-pay

## 2021-08-25 ENCOUNTER — Ambulatory Visit (HOSPITAL_COMMUNITY): Payer: Medicaid Other

## 2021-08-26 ENCOUNTER — Ambulatory Visit (HOSPITAL_COMMUNITY): Payer: Medicaid Other

## 2021-08-27 ENCOUNTER — Ambulatory Visit (HOSPITAL_COMMUNITY)
Admission: RE | Admit: 2021-08-27 | Discharge: 2021-08-27 | Disposition: A | Discharge status: Home / Self Care | Payer: Medicaid Other | Source: Ambulatory Visit | Attending: Nurse Practitioner | Admitting: Nurse Practitioner

## 2021-08-27 ENCOUNTER — Ambulatory Visit (HOSPITAL_COMMUNITY): Payer: Self-pay

## 2021-08-27 ENCOUNTER — Encounter (HOSPITAL_COMMUNITY): Payer: Self-pay

## 2021-08-27 ENCOUNTER — Other Ambulatory Visit: Payer: Self-pay

## 2021-08-27 VITALS — BP 125/68 | HR 68 | Temp 98.1°F | Resp 16 | Ht 62.0 in | Wt 160.1 lb

## 2021-08-27 DIAGNOSIS — R350 Frequency of micturition: Secondary | ICD-10-CM | POA: Diagnosis present

## 2021-08-27 LAB — POCT URINALYSIS DIPSTICK, ED / UC
Bilirubin Urine: NEGATIVE
Glucose, UA: NEGATIVE mg/dL
Ketones, ur: NEGATIVE mg/dL
Nitrite: NEGATIVE
Protein, ur: NEGATIVE mg/dL
Specific Gravity, Urine: >=1.03 (ref 1.005–1.030)
Urobilinogen, UA: 0.2 mg/dL (ref 0.0–1.0)
pH: 5.5 (ref 5.0–8.0)

## 2021-08-27 NOTE — ED Provider Notes (Addendum)
?[place] ? ? ? ?CSN: [id number] ?Arrival date & time: 08/27/21  1628 ? ? ?  ? ?History   ?Chief Complaint ?Chief Complaint  ?Patient presents with  ? Hypertension  ? Urinary Frequency  ? ? ?HPI ?Rebecca Hancock is a 41 y.o. female.  ? ?Patient presents with complaints of bilateral lower extremity swelling a couple of weeks ago after standing on her legs for a long time.  She is worried her blood pressure is elevated.  She also reports that she has been dehydrated and her urine has been more concentrated.  She denies dysuria, urinary frequency, incontinence of urine, change in the odor of her urine.  She also denies hematuria, abdominal pain, new back pain, suprapubic pain, fevers, nausea/vomiting.  She denies any abnormal vaginal discharge.  She has been trying to drink more water and has been drinking about 3 water bottles per day. ? ?She does not have a primary care provider and is interested in establishing care.  LMP about 3 weeks ago, has paraguard IUD. ? ? ? ? ? ?Past Medical History:  ?Diagnosis Date  ? Anxiety   ? Anxiety 04/27/2012  ? Cholelithiasis 04/2012  ? Depression   ? Depression 04/27/2012  ? Drug abuse ([place])   ? Heroin and opiates  ? Insomnia   ? Insomnia 04/27/2012  ? Insomnia   ? ? ?Patient Active Problem List  ? Diagnosis Date Noted  ? History of severe pre-eclampsia 09/24/2015  ? 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 Depression 04/27/2012  ? Anxiety 04/27/2012  ? Insomnia 04/27/2012  ? History of drug abuse in remission ([place]) 04/25/2012  ? ? ?Past Surgical History:  ?Procedure Laterality Date  ? CHOLECYSTECTOMY  04/26/2012  ? Procedure: LAPAROSCOPIC CHOLECYSTECTOMY;  Surgeon: Gwenyth Ober, MD;  Location: [facility];  Service: General;  Laterality: N/A;  ? ? ?OB History   ? ? Gravida  ?1  ? Para  ?1  ? Term  ?1  ? Preterm  ?   ? AB  ?   ? Living  ?1  ?  ? ? SAB  ?   ? IAB  ?   ? Ectopic  ?   ? Multiple  ?0  ?  Live Births  ?1  ?   ?  ?  ? ? ? ?Home Medications   ? ?Prior to Admission medications   ?Medication Sig Start Date End Date Taking? Authorizing Provider  ?ibuprofen (ADVIL,MOTRIN) 800 MG tablet Take 1 tablet (800 mg total) by mouth 3 (three) times daily. 01/01/18   Zigmund Gottron, NP  ?methadone (DOLOPHINE) 10 MG/ML solution Take 94 mg by mouth daily.     [provider]  ?tobramycin (TOBREX) 0.3 % ophthalmic solution Place 1 drop into the right eye every 6 (six) hours. 07/28/18   Robyn Haber, MD  ? ? ?Family History ?Family History  ?Problem Relation Age of Onset  ? Hepatitis B Father   ? Cancer Father   ?     prostate  ? ? ?Social History ?Social History  ? ?Tobacco Use  ? Smoking status: Every Day  ?  Packs/day: 0.50  ?  Years: 10.00  ?  Pack years: 5.00  ?  Types: Cigarettes, E-cigarettes  ? Smokeless tobacco: Never  ?Substance Use Topics  ? Alcohol use: Yes  ?  Alcohol/week: 4.0 standard drinks  ?  Types: 1 Cans of beer, 3 Standard drinks or equivalent  per week  ?  Comment: occasional  ? Drug use: No  ?  Types: Heroin, Cocaine  ?  Comment: Daily use of heroin- has been clean for over 1 yr  ? ? ? ?Allergies   ?Patient has no known allergies. ? ? ?Review of Systems ?Review of Systems ?Per HPI ? ?Physical Exam ?Triage Vital Signs ?ED Triage Vitals  ?Enc Vitals Group  ?   BP 08/27/21 1702 125/68  ?   Pulse Rate 08/27/21 1702 68  ?   Resp 08/27/21 1702 16  ?   Temp 08/27/21 1702 98.1 ?F (36.7 ?C)  ?   Temp Source 08/27/21 1702 Oral  ?   SpO2 08/27/21 1702 96 %  ?   Weight 08/27/21 1659 160 lb 0.9 oz (72.6 kg)  ?   Height 08/27/21 1659 '5\' 2"'$  (1.575 m)  ?   Head Circumference --   ?   Peak Flow --   ?   Pain Score 08/27/21 1659 0  ?   Pain Loc --   ?   Pain Edu? --   ?   Excl. in [place]? --   ? ?No data found. ? ?Updated Vital Signs ?BP 125/68   Pulse 68   Temp 98.1 ?F (36.7 ?C) (Oral)   Resp 16   Ht '5\' 2"'$  (1.575 m)   Wt 160 lb 0.9 oz (72.6 kg)   SpO2 96%   BMI 29.27 kg/m?  ? ?Visual Acuity ?Right  Eye Distance:   ?Left Eye Distance:   ?Bilateral Distance:   ? ?Right Eye Near:   ?Left Eye Near:    ?Bilateral Near:    ? ?Physical Exam ?Vitals and nursing note reviewed.  ?Constitutional:   ?   General: She is not in acute distress. ?   Appearance: Normal appearance. She is not toxic-appearing.  ?Pulmonary:  ?   Effort: Pulmonary effort is normal. No respiratory distress.  ?Genitourinary: ?   Comments: Deferred-patient declines ?Musculoskeletal:  ?   Right lower leg: No edema.  ?   Left lower leg: No edema.  ?Skin: ?   General: Skin is warm and dry.  ?   Coloration: Skin is not jaundiced or pale.  ?   Findings: No erythema.  ?Neurological:  ?   Mental Status: She is alert and oriented to person, place, and time.  ?Psychiatric:     ?   Mood and Affect: Mood normal.     ?   Behavior: Behavior normal.  ? ? ? ?UC Treatments / Results  ?Labs ?(all labs ordered are listed, but only abnormal results are displayed) ?Labs Reviewed  ?POCT URINALYSIS DIPSTICK, ED / UC - Abnormal; Notable for the following components:  ?    Result Value  ? Hgb urine dipstick LARGE (*)   ? Leukocytes,Ua TRACE (*)   ? All other components within normal limits  ?URINE CULTURE  ?CERVICOVAGINAL ANCILLARY ONLY  ? ? ?EKG ? ? ?Radiology ?No results found. ? ?Procedures ?Procedures (including critical care time) ? ?Medications Ordered in UC ?Medications - No data to display ? ?Initial Impression / Assessment and Plan / UC Course  ?I have reviewed the triage vital signs and the nursing notes. ? ?Pertinent labs & imaging results that were available during my care of the patient were reviewed by me and considered in my medical decision making (see chart for details). ? ?  ?UA today shows large amount of hemoglobin, leukocytes.  Patient is not having any urinary tract  infection symptoms, will send for culture and defer treatment for now. Query spotting from upcoming menses. We will also obtain swab swab to check for bacterial vaginosis, yeast vaginitis  per patient request, although patient denies any vaginal itching or abnormal discharge currently.  She declines screening for STI today. ? ?We will place patient on list for primary care assistance to discuss intermittent leg swelling, concerns for hypertension.  She is normotensive today and her legs are not swollen.  Encouraged plenty of water intake in the meantime-goal is 64 ounces daily. ?Final Clinical Impressions(s) / UC Diagnoses  ? ?Final diagnoses:  ?Urinary frequency  ? ? ? ?Discharge Instructions   ? ?  ?- Please try to increase water intake to 64 oz. Daily ?- We will let you know if the urine culture comes back positive or if the self-swab shows BV or yeast ?- I have put you on the list to establish care with a primary care provider ? ? ? ? ?ED Prescriptions   ?None ?  ? ?PDMP not reviewed this encounter. ?  ?Eulogio Bear, NP ?08/27/21 1756 ? ?  ?Eulogio Bear, NP ?08/27/21 1756 ? ?

## 2021-08-27 NOTE — Discharge Instructions (Addendum)
-   Please try to increase water intake to 64 oz. Daily ?- We will let you know if the urine culture comes back positive or if the self-swab shows BV or yeast ?- I have put you on the list to establish care with a primary care provider ?

## 2021-08-27 NOTE — ED Triage Notes (Addendum)
Pt reports high blood pressure, leg swelling and urinary frequency and dehydration x 1 month. States her mom took her BP and it was elevated. Hx of gestational HTN 6 years ago.  ?

## 2021-08-29 LAB — URINE CULTURE: Culture: <10000 — AB

## 2021-08-30 ENCOUNTER — Telehealth (HOSPITAL_COMMUNITY): Payer: Self-pay | Admitting: Emergency Medicine

## 2021-08-30 LAB — CERVICOVAGINAL ANCILLARY ONLY
Bacterial Vaginitis (gardnerella): POSITIVE — AB
Candida Glabrata: NEGATIVE
Candida Vaginitis: NEGATIVE
Comment: NEGATIVE
Comment: NEGATIVE
Comment: NEGATIVE

## 2021-08-30 MED ORDER — METRONIDAZOLE 500 MG PO TABS: 500.0000 mg | ORAL_TABLET | Freq: Two times a day (BID) | ORAL | 0 refills | Status: DC

## 2022-02-02 ENCOUNTER — Ambulatory Visit (HOSPITAL_COMMUNITY)
Admission: EM | Admit: 2022-02-02 | Discharge: 2022-02-02 | Disposition: A | Discharge status: Home / Self Care | Payer: Medicaid Other | Attending: Nurse Practitioner | Admitting: Nurse Practitioner

## 2022-02-02 DIAGNOSIS — I10 Essential (primary) hypertension: Secondary | ICD-10-CM | POA: Diagnosis not present

## 2022-02-02 MED ORDER — HYDROCHLOROTHIAZIDE 25 MG PO TABS: 25.0000 mg | ORAL_TABLET | Freq: Every day | ORAL | 0 refills | Status: AC

## 2022-02-02 NOTE — ED Provider Notes (Signed)
Rebecca Hancock    CSN: [id number] Arrival date & time: 02/02/22  1234      History   Chief Complaint Chief Complaint  Patient presents with   Dizziness    HPI Rebecca Hancock is a 41 y.o. female.   HPI  Ms. Cancel is in today for Elevated BP.  She is having headache, dizziness, occasional  chest pain. She experienced edema in the past. She is on Methadone and she vaps.  She endorsees that her diet is not completely healthy. She denies visual changes, shortness of breath, dyspnea on exertion, nausea, vomiting or any edema.   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) 04/25/2012    Past Surgical History:  Procedure Laterality Date   CHOLECYSTECTOMY  04/26/2012   Procedure: LAPAROSCOPIC CHOLECYSTECTOMY;  Surgeon: Gwenyth Ober, MD;  Location: [facility];  Service: General;  Laterality: N/A;    OB History     Gravida  1   Para  1   Term  1   Preterm      AB      Living  1      SAB      IAB      Ectopic      Multiple  0   Live Births  1            Home Medications    Prior to Admission medications   Medication Sig Start Date End Date Taking? Authorizing Provider  hydrochlorothiazide (HYDRODIURIL) 25 MG tablet Take 1 tablet (25 mg total) by mouth daily. 02/02/22 03/04/22 Yes Catheline Hixon, Diona Foley, NP  ibuprofen (ADVIL,MOTRIN) 800 MG tablet Take 1 tablet (800 mg total) by mouth 3 (three) times daily. 01/01/18   Zigmund Gottron, NP  methadone  (DOLOPHINE) 10 MG/ML solution Take 94 mg by mouth daily.     [provider]    Family History Family History  Problem Relation Age of Onset   Hepatitis B Father    Cancer Father        prostate    Social History Social History   Tobacco Use   Smoking status: Every Day    Packs/day: 0.50    Years: 10.00    Total pack years: 5.00    Types: Cigarettes, E-cigarettes   Smokeless tobacco: Never  Substance Use Topics   Alcohol use: Yes    Alcohol/week: 4.0 standard drinks of alcohol    Types: 1 Cans of beer, 3 Standard drinks or equivalent per week    Comment: occasional   Drug use: No    Types: Heroin, Cocaine    Comment: Daily use of heroin- has been clean for over 1 yr     Allergies   Patient has no known allergies.   Review of Systems Review of Systems   Physical Exam Triage Vital Signs ED Triage Vitals  Enc Vitals Group     BP 02/02/22 1345 (!) 140/98  Pulse Rate 02/02/22 1345 93     Resp 02/02/22 1345 17     Temp 02/02/22 1345 98.2 F (36.8 C)     Temp Source 02/02/22 1345 Oral     SpO2 02/02/22 1345 96 %     Weight --      Height --      Head Circumference --      Peak Flow --      Pain Score 02/02/22 1343 0     Pain Loc --      Pain Edu? --      Excl. in [place]? --    No data found.  Updated Vital Signs BP (!) 140/98 (BP Location: Right Arm)   Pulse 93   Temp 98.2 F (36.8 C) (Oral)   Resp 17   SpO2 96%   Visual Acuity Right Eye Distance:   Left Eye Distance:   Bilateral Distance:    Right Eye Near:   Left Eye Near:    Bilateral Near:     Physical Exam Constitutional:      General: She is not in acute distress.    Appearance: She is obese. She is not ill-appearing, toxic-appearing or diaphoretic.  HENT:     Head: Normocephalic and atraumatic.  Cardiovascular:     Rate and Rhythm: Normal rate and regular rhythm.     Pulses: Normal pulses.     Heart sounds: Normal heart sounds.  Pulmonary:     Effort: Pulmonary effort  is normal.     Comments: Diminshed  Musculoskeletal:        General: Normal range of motion.     Right lower leg: No edema.     Left lower leg: No edema.  Skin:    General: Skin is warm and dry.     Capillary Refill: Capillary refill takes less than 2 seconds.  Neurological:     Mental Status: She is alert and oriented to person, place, and time.  Psychiatric:        Behavior: Behavior normal.      UC Treatments / Results  Labs (all labs ordered are listed, but only abnormal results are displayed) Labs Reviewed - No data to display  EKG   Radiology No results found.  Procedures Procedures (including critical care time)  Medications Ordered in UC Medications - No data to display  Initial Impression / Assessment and Plan / UC Course  I have reviewed the triage vital signs and the nursing notes.  Pertinent labs & imaging results that were available during my care of the patient were reviewed by me and considered in my medical decision making (see chart for details).    Elevated BP  Final Clinical Impressions(s) / UC Diagnoses   Final diagnoses:  Hypertension, unspecified type     Discharge Instructions      You have been started on Hydrochlorothiazide 25 mg daily Encouraged on going compliance with current medication regimen Encouraged home monitoring and recording BP <130/80 Eating a heart-healthy diet with less salt Encouraged regular physical activity  Recommend Weight loss Referral has been placed for Primary Care Provider       ED Prescriptions     Medication Sig Dispense Auth. Provider   hydrochlorothiazide (HYDRODIURIL) 25 MG tablet Take 1 tablet (25 mg total) by mouth daily. 30 tablet Vevelyn Francois, NP      PDMP not reviewed this encounter.   Dionisio David [place], [place] 02/02/22 [phone]

## 2022-02-02 NOTE — ED Triage Notes (Addendum)
Pt reports that today when getting ready for work was dizzy. Reports was anxious and stressed because couldn't find things when getting ready. Denies dizziness now. Had issues with BP being high before and mother is a NP and told her that hypertension is silent killer, so wanted BP checked to make sure if related to HTN and needed to be on medications. Doesn't have a PCP. Goes to Methadone clinic.

## 2022-02-02 NOTE — Discharge Instructions (Addendum)
You have been started on Hydrochlorothiazide 25 mg daily Encouraged on going compliance with current medication regimen Encouraged home monitoring and recording BP <130/80 Eating a heart-healthy diet with less salt Encouraged regular physical activity  Recommend Weight loss Referral has been placed for Primary Care Provider

## 2022-02-22 NOTE — Progress Notes (Deleted)
No diagnosis found.  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 , Disp: , Rfl:   Past Surgical History:  Procedure Laterality Date   CHOLECYSTECTOMY  04/26/2012   Procedure: LAPAROSCOPIC CHOLECYSTECTOMY;  Surgeon: Gwenyth Ober, MD;  Location: [facility];  Service: General;  Laterality: N/A;       Latest Ref Rng & Units 07/25/2015    5:53 PM 07/25/2015    9:00 AM 07/09/2015   11:21 AM  CMP  Glucose 65 - 99 mg/dL 88  77  93   BUN 6 - 20 mg/dL '11  14  9   '$ Creatinine 0.44 - 1.00 mg/dL 0.79  0.79  0.60   Sodium 135 - 145 mmol/L 137  132  134   Potassium 3.5 - 5.1 mmol/L 4.1  4.3  4.4   Chloride 101 - 111 mmol/L 105  104  102   CO2 22 - 32 mmol/L '24  21  21   '$ Calcium 8.9 - 10.3 mg/dL 9.0  9.1  9.1   Total Protein 6.5 - 8.1 g/dL 6.4  6.6  6.3   Total Bilirubin 0.3 - 1.2 mg/dL 0.2  0.2  0.2   Alkaline Phos 38 - 126 U/L 114  129  130   AST 15 - 41 U/L '27  26  26   '$ ALT 14 - 54 U/L '15  15  20       '$ Latest Ref Rng & Units 07/26/2015    5:20 AM 07/25/2015    5:53 PM 07/25/2015    9:00 AM  CBC  WBC 4.0 - 10.5 K/uL 16.6  13.0  9.1   Hemoglobin 12.0 - 15.0 g/dL 9.4  11.9  12.2   Hematocrit 36.0 - 46.0 % 27.6  35.0  35.7   Platelets 150 - 400 K/uL 216  229  256       Latest Ref Rng & Units 03/03/2010    4:17 AM 02/28/2010   10:43 AM 02/28/2010    8:05 AM   LIPIDS  Total cholesterol 0 - 200 mg/dL 149        ATP III CLASSIFICATION:  <200     mg/dL   Desirable  200-239  mg/dL   Borderline High  >=240    mg/dL   High         114        ATP III CLASSIFICATION:  <200     mg/dL   Desirable  200-239  mg/dL   Borderline High  >=240    mg/dL   High         61        ATP III CLASSIFICATION:  <200     mg/dL   Desirable  200-239  mg/dL   Borderline High  >=240    mg/dL   High  QUESTIONABLE RESULTS, RECOMMEND RECOLLECT TO VERIFY   HDL cholesterol >39 mg/dL 25  32  14 QUESTIONABLE RESULTS, RECOMMEND RECOLLECT TO VERIFY   LDL (calc) 0 - 99 mg/dL 96        Total Cholesterol/HDL:CHD Risk Coronary Heart Disease Risk Table                     Men   Women  1/2 Average Risk   3.4   3.3  Average Risk       5.0   4.4  2 X Average Risk   9.6   7.1  3 X Average Risk  23.4   11.0        Use the calculated Patient Ratio above and the CHD Risk Table to determine the patient's CHD Risk.        ATP III CLASSIFICATION (LDL):  <100     mg/dL   Optimal  100-129  mg/dL   Near or Above                    Optimal  130-159  mg/dL   Borderline  160-189  mg/dL   High  >190     mg/dL   Very High  59        Total Cholesterol/HDL:CHD Risk Coronary Heart Disease Risk Table                     Men   Women  1/2 Average Risk   3.4   3.3  Average Risk       5.0   4.4  2 X Average Risk   9.6   7.1  3 X Average Risk  23.4   11.0        Use the calculated Patient Ratio above and the CHD Risk Table to determine the patient's CHD Risk.        ATP III CLASSIFICATION (LDL):  <100     mg/dL   Optimal  100-129  mg/dL   Near or Above                    Optimal  130-159  mg/dL   Borderline  160-189  mg/dL   High  >190     mg/dL   Very High  30        Total Cholesterol/HDL:CHD Risk Coronary Heart Disease Risk Table                     Men   Women  1/2 Average Risk   3.4   3.3  Average Risk       5.0   4.4  2 X Average Risk   9.6   7.1  3 X Average Risk   23.4   11.0        Use the calculated Patient Ratio above and the CHD Risk Table to determine the patient's CHD Risk.        ATP III CLASSIFICATION (LDL):  <100     mg/dL   Optimal  100-129  mg/dL   Near or Above                    Optimal  130-159  mg/dL   Borderline  160-189  mg/dL   High  >190     mg/dL   Very High  C  T.Chol/HDL Ratio RATIO 6.0  3.6  4.4   VLDL cholesterol 0 -  40 mg/dL '28  23  17   '$ Triglycerides <150 mg/dL 140  117  84 QUESTIONABLE RESULTS, RECOMMEND RECOLLECT TO VERIFY     C Corrected result       No data to display

## 2022-02-23 ENCOUNTER — Encounter: Payer: Medicaid Other | Admitting: Student
